# Patient Record
Sex: Female | Born: 1935 | Race: White | Hispanic: No | Smoking: Never smoker
Health system: Southern US, Community
[De-identification: ages and names within clinical notes are randomized; demographics above are authoritative.]

## PROBLEM LIST (undated history)

## (undated) DIAGNOSIS — I10 Essential (primary) hypertension: Secondary | ICD-10-CM

## (undated) DIAGNOSIS — J45909 Unspecified asthma, uncomplicated: Secondary | ICD-10-CM

## (undated) HISTORY — PX: HIP SURGERY: SHX245

---

## 1997-10-20 ENCOUNTER — Other Ambulatory Visit: Admission: RE | Admit: 1997-10-20 | Discharge: 1997-10-20 | Payer: Self-pay | Admitting: *Deleted

## 1999-11-13 ENCOUNTER — Other Ambulatory Visit: Admission: RE | Admit: 1999-11-13 | Discharge: 1999-11-13 | Payer: Self-pay | Admitting: *Deleted

## 2000-10-10 ENCOUNTER — Ambulatory Visit (HOSPITAL_COMMUNITY): Admission: RE | Admit: 2000-10-10 | Discharge: 2000-10-10 | Payer: Self-pay | Admitting: Orthopedic Surgery

## 2000-10-10 ENCOUNTER — Encounter: Payer: Self-pay | Admitting: Orthopedic Surgery

## 2000-12-29 ENCOUNTER — Other Ambulatory Visit: Admission: RE | Admit: 2000-12-29 | Discharge: 2000-12-29 | Payer: Self-pay | Admitting: *Deleted

## 2001-01-01 ENCOUNTER — Ambulatory Visit: Admission: RE | Admit: 2001-01-01 | Discharge: 2001-01-01 | Payer: Self-pay | Admitting: Pulmonary Disease

## 2001-01-05 ENCOUNTER — Encounter: Payer: Self-pay | Admitting: Orthopedic Surgery

## 2001-01-12 ENCOUNTER — Inpatient Hospital Stay (HOSPITAL_COMMUNITY): Admission: RE | Admit: 2001-01-12 | Discharge: 2001-01-15 | Payer: Self-pay | Admitting: Orthopedic Surgery

## 2001-01-12 ENCOUNTER — Encounter: Payer: Self-pay | Admitting: Orthopedic Surgery

## 2001-01-15 ENCOUNTER — Inpatient Hospital Stay (HOSPITAL_COMMUNITY)
Admission: RE | Admit: 2001-01-15 | Discharge: 2001-01-20 | Payer: Self-pay | Admitting: Physical Medicine & Rehabilitation

## 2002-02-27 ENCOUNTER — Encounter: Payer: Self-pay | Admitting: Emergency Medicine

## 2002-02-27 ENCOUNTER — Inpatient Hospital Stay (HOSPITAL_COMMUNITY): Admission: EM | Admit: 2002-02-27 | Discharge: 2002-02-28 | Payer: Self-pay | Admitting: Emergency Medicine

## 2002-04-06 ENCOUNTER — Other Ambulatory Visit: Admission: RE | Admit: 2002-04-06 | Discharge: 2002-04-06 | Payer: Self-pay | Admitting: *Deleted

## 2003-04-27 ENCOUNTER — Encounter: Payer: Self-pay | Admitting: Gastroenterology

## 2003-04-27 ENCOUNTER — Encounter: Admission: RE | Admit: 2003-04-27 | Discharge: 2003-04-27 | Payer: Self-pay | Admitting: Gastroenterology

## 2003-07-05 ENCOUNTER — Inpatient Hospital Stay (HOSPITAL_COMMUNITY): Admission: EM | Admit: 2003-07-05 | Discharge: 2003-07-06 | Payer: Self-pay | Admitting: Emergency Medicine

## 2005-01-10 ENCOUNTER — Observation Stay (HOSPITAL_COMMUNITY): Admission: EM | Admit: 2005-01-10 | Discharge: 2005-01-11 | Payer: Self-pay | Admitting: Emergency Medicine

## 2005-03-06 ENCOUNTER — Inpatient Hospital Stay (HOSPITAL_COMMUNITY): Admission: RE | Admit: 2005-03-06 | Discharge: 2005-03-11 | Payer: Self-pay | Admitting: Orthopedic Surgery

## 2005-03-11 ENCOUNTER — Ambulatory Visit: Payer: Self-pay | Admitting: Physical Medicine & Rehabilitation

## 2005-03-11 ENCOUNTER — Inpatient Hospital Stay (HOSPITAL_COMMUNITY)
Admission: RE | Admit: 2005-03-11 | Discharge: 2005-03-15 | Payer: Self-pay | Admitting: Physical Medicine & Rehabilitation

## 2005-06-21 ENCOUNTER — Ambulatory Visit: Payer: Self-pay | Admitting: Internal Medicine

## 2005-06-27 ENCOUNTER — Ambulatory Visit: Payer: Self-pay | Admitting: Pulmonary Disease

## 2005-08-15 ENCOUNTER — Ambulatory Visit: Payer: Self-pay | Admitting: Pulmonary Disease

## 2005-11-26 ENCOUNTER — Ambulatory Visit: Payer: Self-pay | Admitting: Pulmonary Disease

## 2006-11-07 IMAGING — CR DG HIP (WITH OR WITHOUT PELVIS) 2-3V*L*
3 series · 3 of 3 positions shown · non-contrast
Comparison: none

CLINICAL DATA: Recurrent hip dislocation.   Preoperative film. 
 LEFT HIP - 2 VIEW AND PELVIS - 1 VIEW:

[t pelvis a.p.]
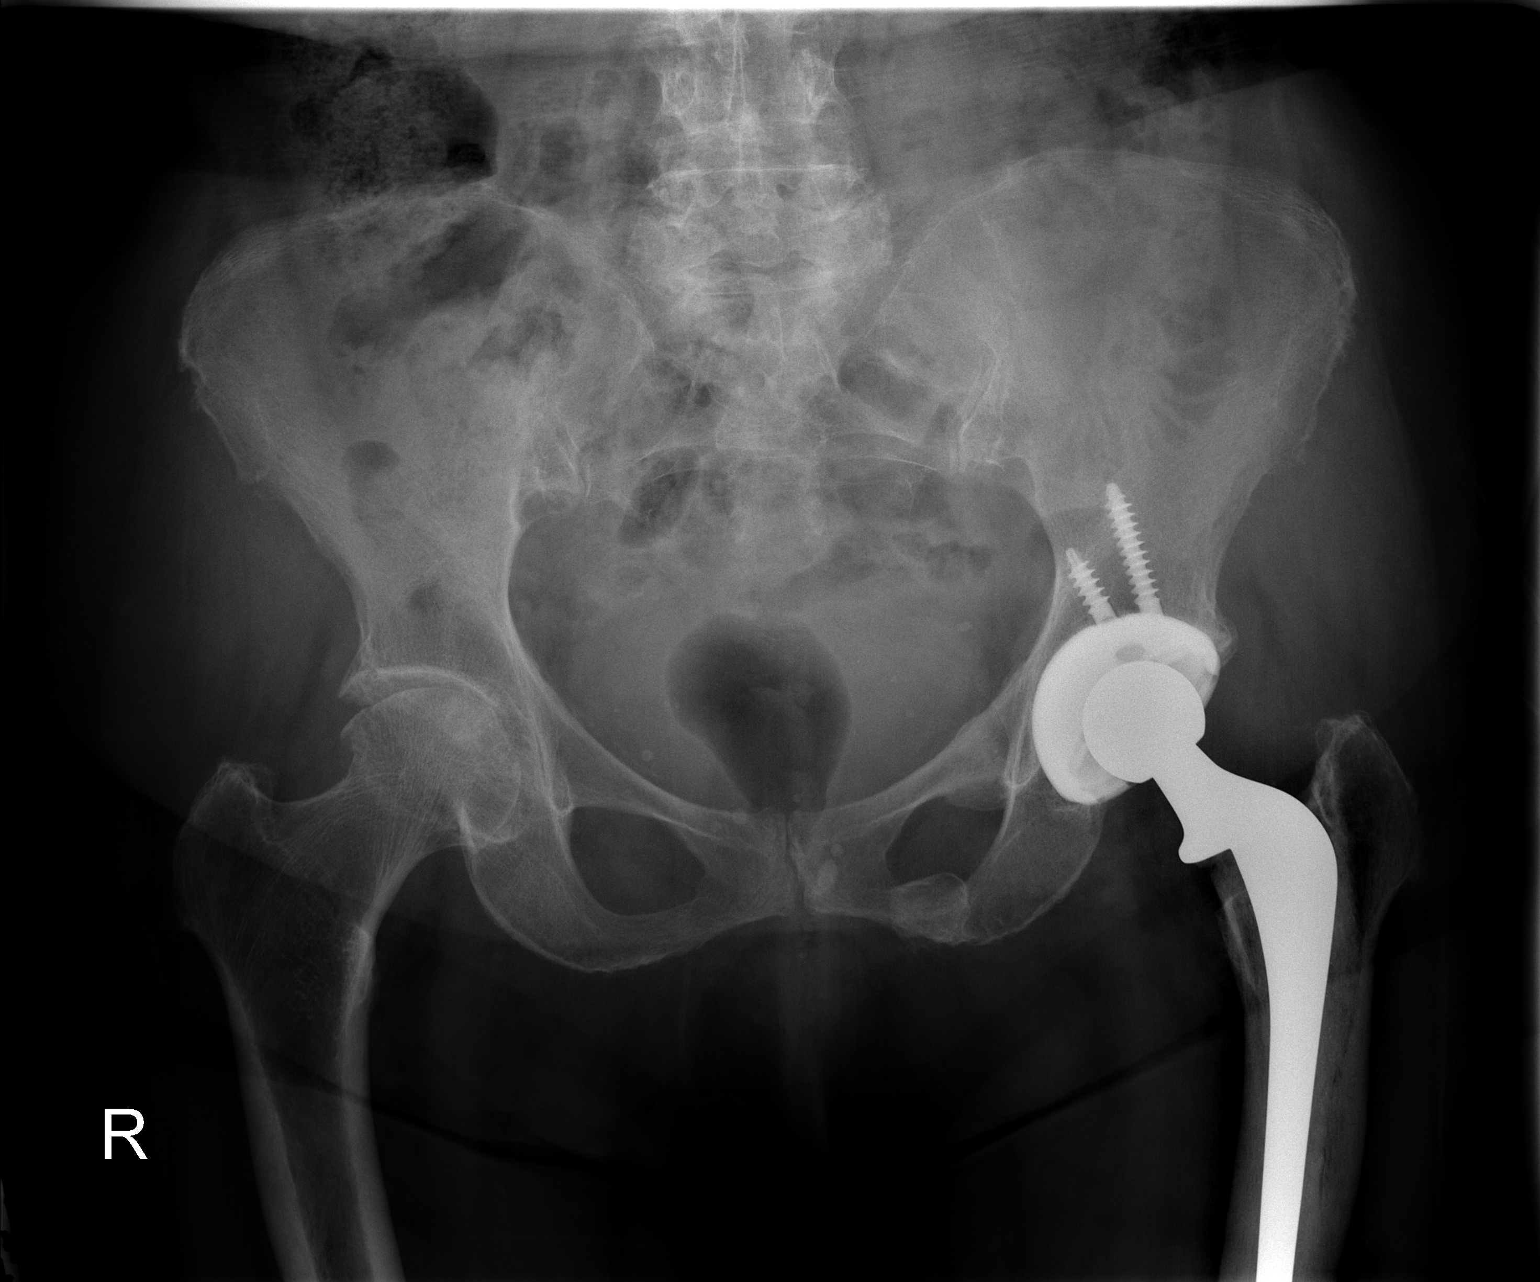

[t hip ap left]
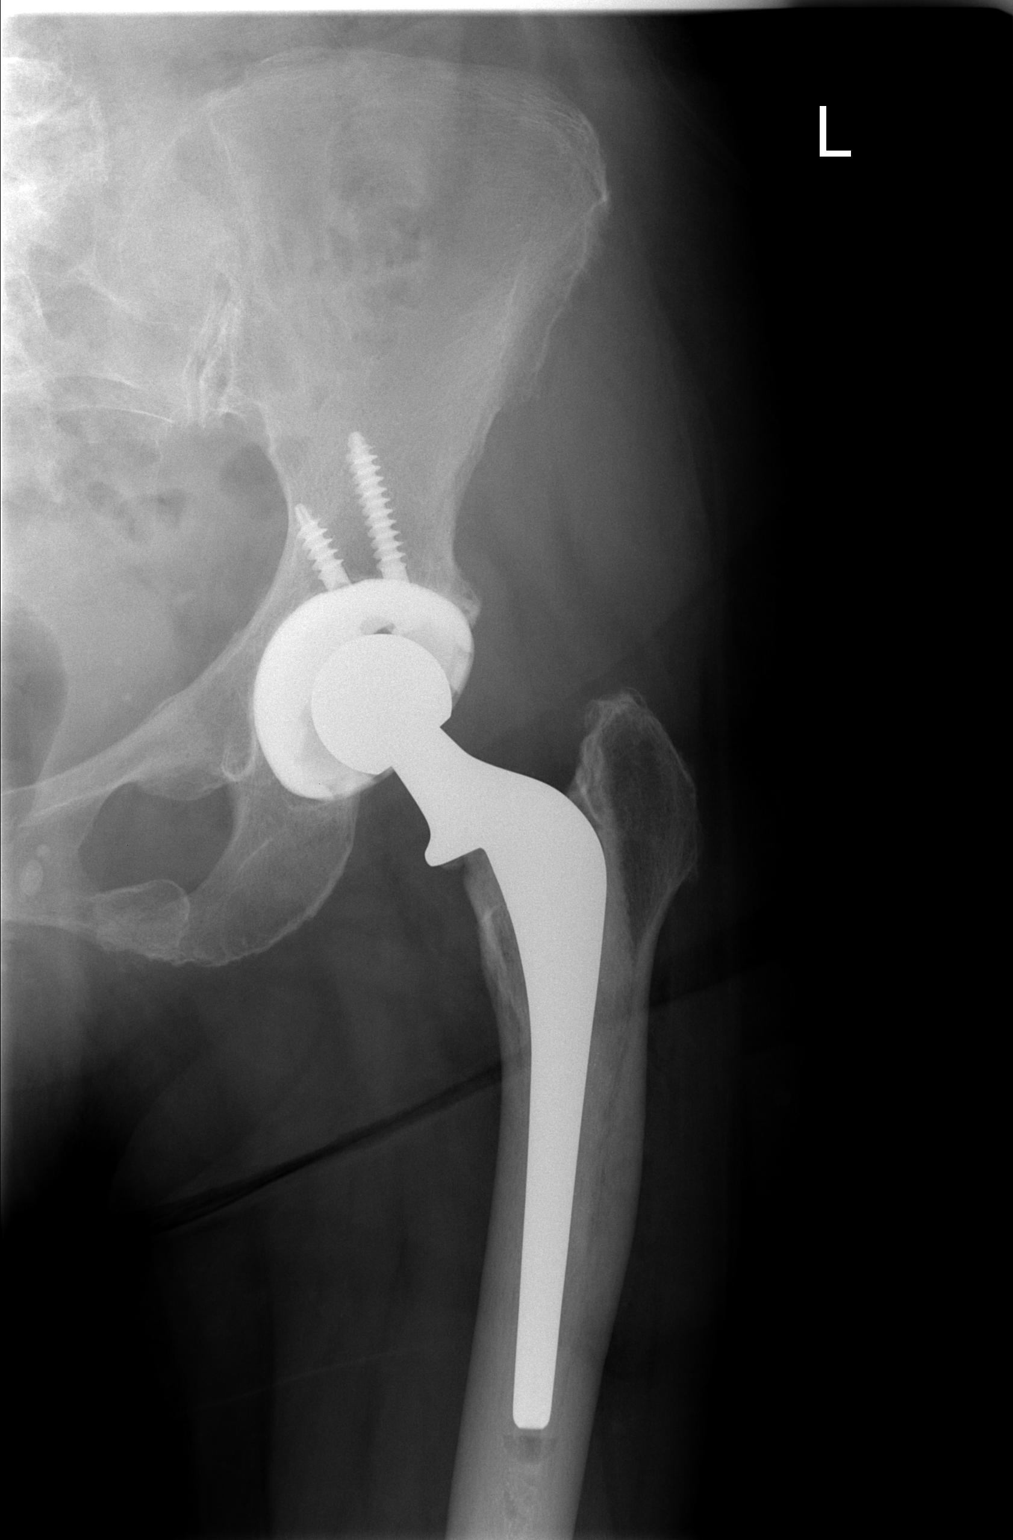

[t hip frog leg left]
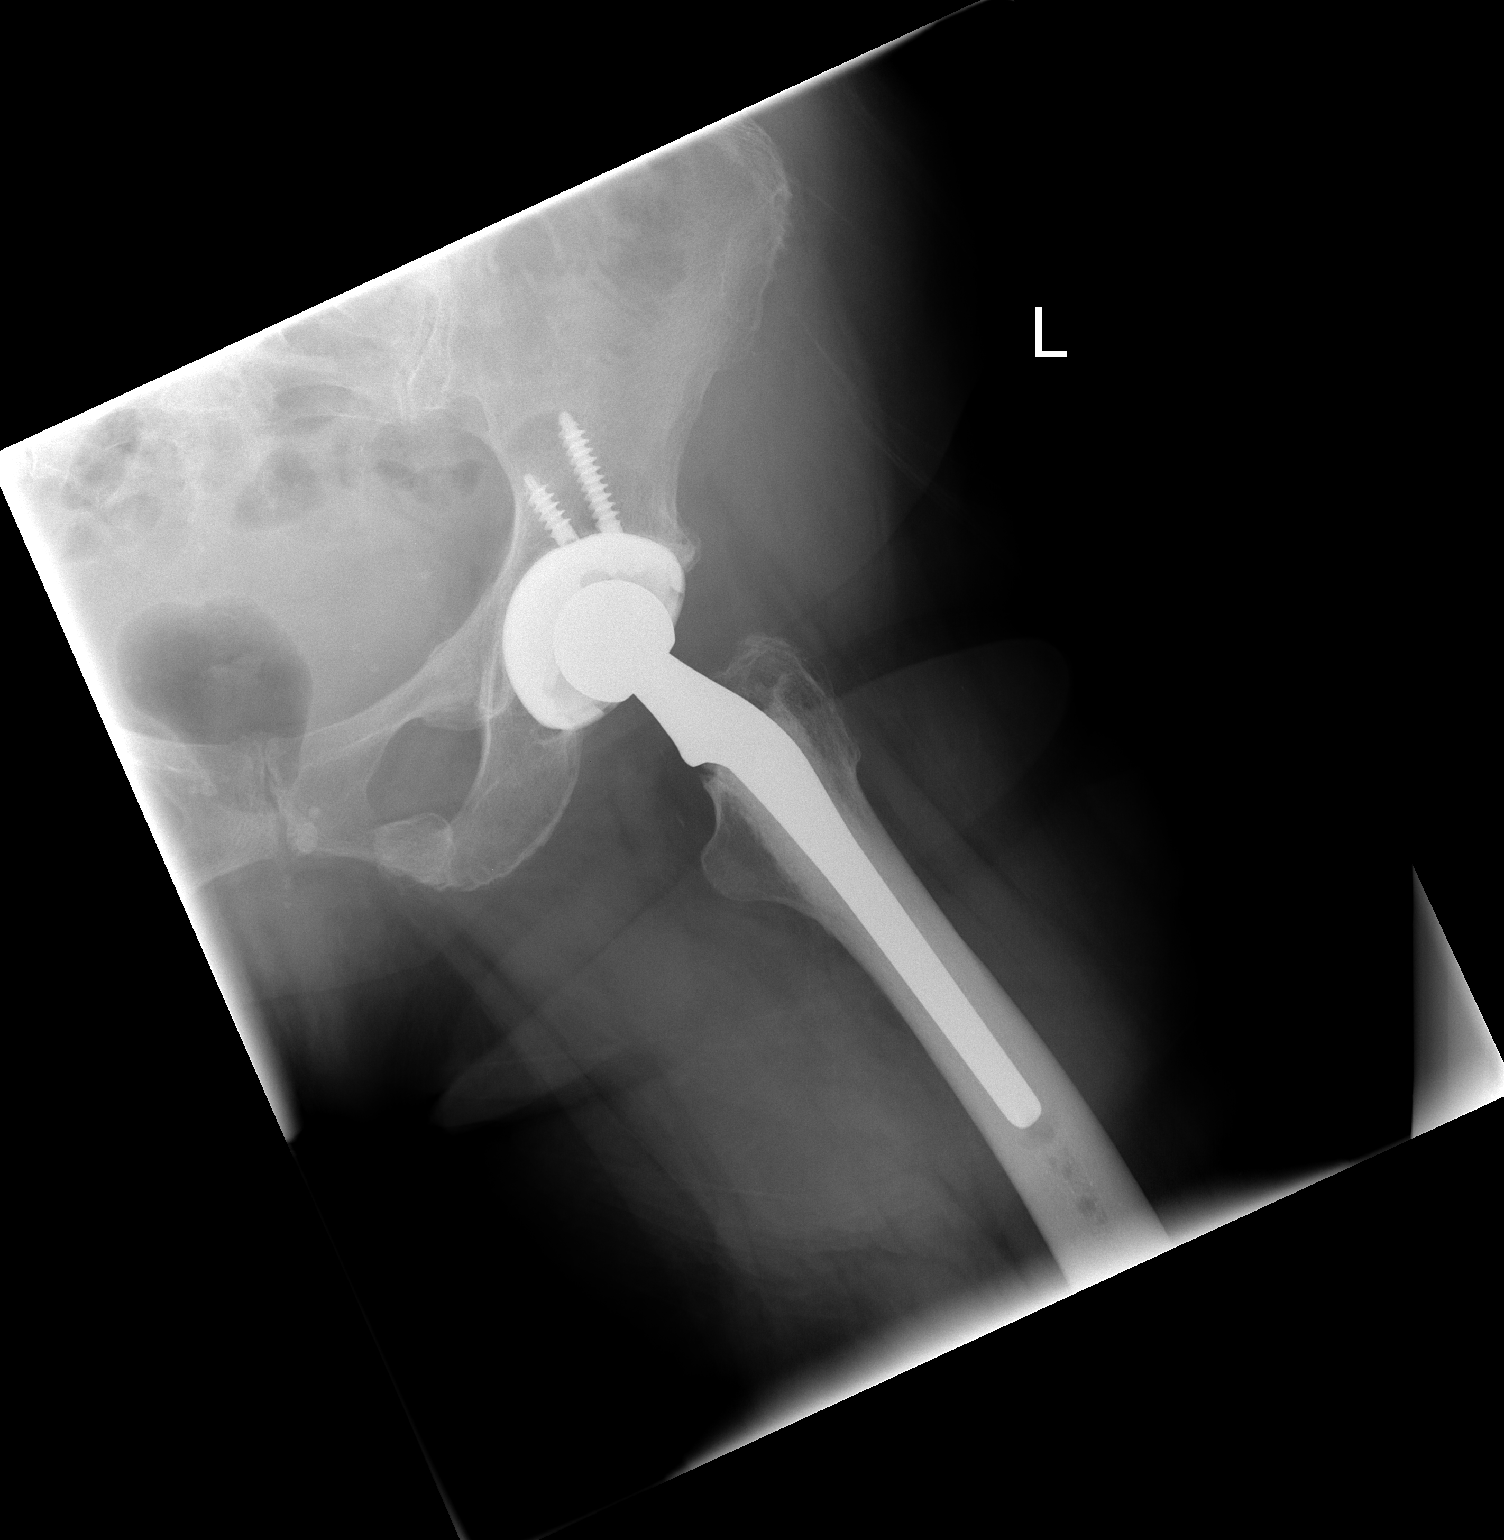

[3 of 3 positions shown; findings below may reference images not displayed]

FINDINGS: The patient has a total left hip arthroplasty.  Device is located.  No acute fracture.  Old left superior and inferior pubic rami fractures are noted.  Patient has marked right hip osteoarthritis.
IMPRESSION: 1. Left total hip arthroplasty without evidence of complication.  
 2. Old left superior and inferior pubic rami fractures. 
 3. Right hip osteoarthritis.

## 2010-11-23 NOTE — Discharge Summary (Signed)
[facility]  Patient:    [name], [name]                      MRN: [id number] Adm. Date:  [id number] Disc. Date: 01/15/01 Attending:  Loanne Drilling Dictator:   Irena Cords, P.A.-C CC:         Nadine Counts, M.D., [place], [place]   Discharge Summary  PRIMARY DIAGNOSES:  1. Osteoarthritis, left hip.  2. Hyponatremia -- improving.  3. Hypokalemia -- resolved.  4. Acute postop hemolytic anemia -- stabilizing.  SECONDARY DIAGNOSES:  1. Mild hypertension.  2. Gastroesophageal reflux disease.  3. Osteoporosis.  SURGICAL PROCEDURE:  Left total hip arthroplasty by Dr. Ollen Gross with the assistance of Ralene Bathe, P.A. on January 12, 2001; please see operative summary for further details.  CONSULTATIONS:  Dr. Rande Brunt. [facility] in Physical Medicine and Rehabilitation.  LABORATORY DATA:  A chest x-ray from July 07, 2000 showed no acute abnormalities, just chronic lung disease, chronic hiatal hernia suspected and then chronic compression fracture of T12.  Pulmonary function tests from January 01, 2001 showed minimal obstructive lung defect.  An adenosine nuclear medicine study from December 23, 2000 showed normal nuclear scan, normal gated images and normal ejection fraction.  Preop EKG from December 16, 2000 showed normal sinus rhythm and borderline left atrial abnormality and this is unconfirmed.  Preop hemoglobin was 11.7; this dropped down to 9.0 on July 11th.  Preop chemistry was all within normal limits with the exception of a low sodium of 130, potassium was 3.9, creatinine was 0.9; this dropped to a low on June 10th of sodium 126 and potassium of 3.4.  Patients PT and INR were 19.1 and 1.9, respectively, on July 11th.  On July 11th, the sodium had improved to 128 and potassium of 4.8.  CHIEF COMPLAINT:  Left hip pain.  BRIEF HISTORY OF PRESENT ILLNESS:  The patient is an 77 year old female, independent ambulator, who has had worsening left  hip and groin pain.  This has been progressively interfering with activities of daily living.  X-rays demonstrated bone-on-bone changes to that hip.  Patient did not seem to be improving with conservative measures and was interested in surgical intervention.  Dr. Lequita Halt discussed the risks, benefits and alternatives to surgical procedure with the patient.  She voiced understanding and wished to proceed.  Signed surgical consents and preoperative labs were obtained.  Also, the patient was sent for cardiac and pulmonary clearance as well as to be seen by her family physician.  All her cardiac nuclear stress tests were normal; she had stable lung function as well.  Dr. Nadine Counts also cleared the patient for the surgical procedure.  HOSPITAL COURSE:  Following the surgical procedure, patient was taken to the recovery room in stable condition and transferred to the orthopedic floor in good condition.  She did well during her hospital stay.  She was started on Coumadin for DVT prophylaxis.  Her INRs reached a level of 1.9 prior to discharge.  She was initially on PCA morphine for pain management; this was then switched over to Darvocet-N p.o., which she tolerated well and controlled her pain well.  Patient was noted to have some hyponatremia postop.  Her IV fluids were changed to normal saline initially.  She continued to drop to a low of 126 sodium.  She was then fluid restricted to 1200 cc and this improved her sodium to 128.  She had some postop hypokalemia as well  with a low of 3.4 on July 10th.  She was treated with K-Dur 40 mEq q.2h. x 3 doses.  The following day on July 11th, her potassium had improved to 4.8.  Also on July 11th, her hemoglobin was down to 9.0 but the patient was asymptomatic so no blood transfusion was performed.  Physical therapy and occupational therapy were started with the patient for total hip protocol.  She was touchdown weightbearing on that hip; total hip  precautions were followed as well.  A rehab consult was placed and Dr. Thomasena Edis evaluated the patient and felt she was an acceptable candidate for SACU placement.  By postop day 3, she was stable orthopedically and was ready for transfer to the [facility].  Patient also received four doses of Ancef 1 g IV postop while in the hospital.  DISCHARGE DISPOSITION:  Patient will be discharged to the [facility].  DIET:  Regular.  FOLLOWUP:  Follow up with Dr. Lequita Halt two weeks postop; she is to call [phone] for an appointment.  ACTIVITY:  Follow total hip dislocation precautions.  She is to remain touchdown weightbearing to 50% partial weightbearing with the assistance of physical therapy and occupational therapy.  SPECIAL DISCHARGE INSTRUCTIONS:  Once-daily dressing changes.  She may begin showering on postop day #5.  Pro times per protocol.  She should have an H&H repeated in the morning on July 12th to make sure her hemoglobin does not continue dropping.  DISCHARGE MEDICATIONS:  1. Fosamax 70 mg every Wednesday.  2. Prevacid 30 mg p.o. b.i.d.  3. Dyazide 37.5/25 mg p.o. q.a.m.  4. Calcium 250 mg p.o. q.i.d.  5. Multivitamin one p.o. q.d.  6. Trinsicon one p.o. t.i.d. p.c.  7. Robaxin 500 mg p.o. q.8h. p.r.n. spasm.  8. Darvocet-N 100 one to two p.o. q.4-6h. p.r.n. pain, maximum of 4 g of     acetaminophen in 24-hour period.  9. Coumadin per pharmacy dosing. 10. Rhinocort one spray q.d.  CONDITION ON DISCHARGE:  Good and improved. DD:  01/15/01 TD:  01/15/01 Job: [id number]

## 2010-11-23 NOTE — Op Note (Signed)
[name], [name]               ACCOUNT NO.:  [id number]   MEDICAL RECORD NO.:  [id number]          PATIENT TYPE:  INP   LOCATION:  0005                         FACILITY:  [facility]   PHYSICIAN:  Ollen Gross, M.D.    DATE OF BIRTH:  1915/05/22   DATE OF PROCEDURE:  03/06/2005  DATE OF DISCHARGE:                                 OPERATIVE REPORT   PREOPERATIVE DIAGNOSIS:  Recurrent instability, left hip.   POSTOPERATIVE DIAGNOSIS:  Recurrent instability, left hip.   PROCEDURE:  Left hip acetabular revision to constrain liner.   SURGEON:  Ollen Gross, M.D.   ANESTHESIA:  Alexzandrew L. Julien Girt, P.A.   ANESTHESIA:  General.   ESTIMATED BLOOD LOSS:  Less than 100.   DRAINS:  Hemovac x1.   COMPLICATIONS:  None.   CONDITION:  Stable to recovery.   CLINICAL ILLNESS AND PURPOSE:  An 77 year old female had a left total hip  done several years ago. She had had three dislocations approximately a year  apart each. She has not been fully compliant with her hip precautions which  has lead to dislocations. It is felt that she would benefit from conversion  to a more constrained system to prevent future dislocations in the future.  She presents now for revision to a constrained implant.   PROCEDURE IN DETAIL:  After successful administration of spinal anesthetic,  the patient is placed in a right lateral decubitus position with the left  side up and held with the hip positioner. Left lower extremity was isolated  from her perineum with plastic drapes and prepped and draped in the usual  sterile fashion. Previous posterolateral incisions were utilized. Skin cut  with a 10 blade, through subcutaneous tissue to the level of the fascia lata  which was incised in line with the skin incision. Sciatica nerve was  palpated and protected, and the pseudocapsule excised off the femur. The  joint was identified and the hip placed through a range of motion. She was  dislocating posteriorly at a  position of approximately 70 degrees flexion  and 40 degrees abduction and 40 degrees internal rotation. She otherwise was  stable.   I removed the femoral head with a bone tap and then retracted the femur  anteriorly to gain acetabular exposure. The polyethylene liner is removed  from the acetabular shell with the extraction component. The shell is in  excellent position as is the femoral component. It was a size 50-mm Pinnacle  acetabular shell. We put the 50-mm Pinnacle constrained liner into the  acetabular shell and impacted it so it was locked into position. A 28+ 1.5  head is placed onto the femoral stem, and the hip is reduced. The locking  ring is then passed over the acetabular polyethylene. The hip is placed  through a range of motion. She was stable to full extension, flexion, and  rotation, 70 degrees flexion, 40 degrees adduction, and about 70 degrees  internal rotation and 90 degrees flexion, about 70 degrees internal  rotation. The socket was not impinging upon at those limits of range of  motion. Wound is then  copiously irrigated with saline solution, and the  pseudocapsule reattached to the femur posteriorly for drill holes. Fascia  lata was closed over a Hemovac drain with #1 Vicryl and Ethibond. Subcu  closed with #1 Vicryl, then 2-0 Vicryl, and subcuticular running 4-0  Monocryl. Incision is clean and dry. Steri-Strips and a bulky sterile  dressing applied. Drain was hooked to suction, and she was awakened and  transported to recovery in stable condition.      Ollen Gross, M.D.  Electronically Signed     FA/MEDQ  D:  03/06/2005  T:  03/06/2005  Job:  [id number]

## 2010-11-23 NOTE — Op Note (Signed)
[facility]  Patient:    [name], [name]                      MRN: [id number] Proc. Date: 01/12/01 Adm. Date:  [id number] Attending:  Ollen Gross V                           Operative Report  PREOPERATIVE DIAGNOSIS:  Osteoarthritis left hip.  POSTOPERATIVE DIAGNOSIS:  Osteoarthritis left hip.  PROCEDURE:  Left total hip arthroplasty.  SURGEON:  Ollen Gross, M.D.  ASSISTANT:  French Ana Shuford, P.A.-C.  ANESTHESIA:  General.  ESTIMATED BLOOD LOSS:  400.  DRAINS:  Hemovac x 1.  COMPLICATIONS:  None.  CONDITION:  Stable to recovery.  BRIEF CLINICAL NOTE:  [name] is an 77 year old female with severe osteoarthritis of the left hip with pain refractory to nonoperative management.  She presents now for left total hip arthroplasty.  PROCEDURE IN DETAIL:  After the successful administration of general anesthetic, the patient was placed in the right lateral decubitus position with the left side up and held with the hip positioner.  The left lower extremity was isolated from the perineum with plastic drapes and prepped and draped in the usual sterile fashion.  Short posterolateral incision was then made, skin cut with 10 blade, subcutaneous tissue to the level of the fascia lata which was incised in line with the skin incision.  Short external rotators were isolated off the femur.  Sciatic nerve was palpated and then protected.  Capsulectomy was performed and the hip dislocated.  Center of the femoral head was marked, and a trial prosthesis was placed such that the center of the trial head corresponds with the center of her native femoral head.  The osteotomy was then made with an oscillating saw.  The femoral head was removed, the femur retracted anteriorly, and the intracapsule removed. Acetabular exposure was then obtained.  Acetabular reaming was then initiated, starting with a size 43, coursing increments of 2 to 49 and then a 50 mm  Pinnacle acetabular shell is impacted into the acetabulum, matching her native anteversion.  Had excellent fit and was then transfixed with two domed screws.  Trial 28 mm neutral liner was placed.  The femur was then prepared, first with the canal finder, and then the canal was irrigated.  A size 0 and then size 1 broach for the endurance stem is placed.  A 2 high offset neck is then placed onto the size 1 stem.  Reduction is performed with a 28 plus 1.5 head.  She had excellent stability with full extension, full external rotation, and 70 degrees flexion and 40 degrees adduction and 90 degrees internal rotation, then 90 degrees flexion and 90 degrees internal rotation.  The trials were then removed, and the permanent apex hole eliminator was placed into the acetabular shell.  A permanent 28 mm neutral Marathon liner was placed.  The canal restrictor was placed at the appropriate depth in the femoral canal, and then the canal was irrigated with pulsatile lavage.  Cement is mixed and once ready for implantation, is injected into the canal and pressurized.  A size 2 high offset Endurance Luster stem is placed, matching her native anteversion.  Extruded cement is removed.  Once the cement is fully hardened, the permanent 28 plus 1.5 head is placed.  The hip is then reduced with excellent stability throughout.  The wound is copiously irrigated with antibiotic  solution, and the short external rotators are reattached to the femur through drill holes.  The fascia lata and fascia were matched and then closed over a Hemovac drain with interrupted #1 Vicryl, subcutaneous closed with interrupted #1 and 2-0 Vicryl, and subcuticular closed with running 4-0 Monocryl.  Incision is clean and dry and Steri-Strips and a bulky sterile dressing applied.  The patient is then awakened and transported to recovery in stable condition. DD:  01/12/01 TD:  01/12/01 Job: [id number]

## 2010-11-23 NOTE — H&P (Signed)
[facility]  Patient:    [name], [name]                     MRN: [id number] Adm. Date:  01/12/01 Attending:  Ollen Gross, M.D. Dictator:   Druscilla Brownie. Shela Nevin, P.A. CC:         Marguerite Olea, M.D., [place], [place]  Ph# [phone]   History and Physical  DATE OF BIRTH:  [birth year]  CHIEF COMPLAINT:  "Problems with my left hip."  HISTORY OF PRESENT ILLNESS:  This is an 77 year old white female seen by Dr. Lequita Halt for continuing and progressive problems concerning her left hip. She is an active lady but has had progressive worsening pain with her left hip with groin pain as well.  She has been seen by [place] over the last several months with progressive problems.  She has developed a flexion contracture to the knee, to the hip.  She has limitation in range of motion at 5 degrees external rotation, 15-20 degrees abduction.  She has pain on any attempt at hip motion. X-rays taken and compared with a three-month interval of the hip shows further worsening of bone-on-bone changes.  After much discussion and the fact that this is an active lady, it was felt that she would benefit with surgical intervention and being admitted for total hip replacement arthroplasty to the left hip.  The patient has been seen by her family physician and had cardiac and pulmonary clearance.  The cardiology/nuclear medicine stress tests were normal.  Normal nuclear scan, normal gaited images, and normal ejection fraction.  Electrocardiogram showed normal sinus rhythm with a rate of 70 with borderline left atrial abnormality.  The patients granddaughter, who is a Designer, jewellery, accompanied her to her history and physical and has stated that Dr. Marguerite Olea has cleared her for this surgical procedure.  PAST SURGICAL HISTORY:  The patient has had bilateral lens extraction with implants, tonsillectomy as a child.  PAST MEDICAL HISTORY:  Hiatal hernia and mild  hypertension.  She also has reflux disease.  CURRENT MEDICATIONS: 1. Fosamax 70 mg 1 q.d. 2. Prevacid 30 mg 1 b.i.d. 3. Dyazide/HCTZ 37.5/25 mg 1 q.d. 4. Rhinocort nose spray each nostril once a day.  ALLERGIES: 1. CODEINE, which causes dizziness and headaches. 2. VERSED, which causes heart palpitations, weakness, and PVCs.  SOCIAL HISTORY:  Dr. Nadine Counts in [place], [place] is her medical physician.  His phone number is [phone].  The patient neither smokes nor drinks.  FAMILY HISTORY:  Positive for arthritis, PE, and heart failure in the mother and the father died with a "hemorrhage in the stomach."  REVIEW OF SYSTEMS:  CNS:  No seizures or paralysis, numbness, or double vision.  RESPIRATORY:  No productive cough, no hemoptysis, no shortness of breath.  CARDIOVASCULAR:  No chest pain, no angina, no orthopnea. GASTROINTESTINAL:  No nausea, vomiting, melena, or bloody stools. GENITOURINARY:  No discharge, dysuria, hematuria.  MUSCULOSKELETAL:  Primarily in present illness with her hip.  PHYSICAL EXAMINATION:  GENERAL:  Alert and cooperative, friendly, fully oriented 77 year old female who is accompanied by her granddaughter.  HEENT:  Normocephalic.  PERRLA.  EOM intact.  Oropharynx was clear.  CHEST:  Clear to auscultation.  No rhonchi, no rales.  HEART:  Regular rate and rhythm.  No murmurs are heard.  ABDOMEN:  Soft, nontender.  Liver and spleen not felt.  Bowel sounds present.  GENITALIA/RECTAL/PELVIC/BREAST:  Not done, not pertinent to present  illness.  EXTREMITIES:  Left hip as in present illness above.  ADMISSION DIAGNOSES: 1. Severe arthrosis of the left hip. 2. Hypertension. 3. Gastroesophageal reflux disease.  PLAN:  The patient will be admitted for left total hip replacement arthroplasty.  As her family physician is in [place], should we need a cardiologist or general internal medicine physician, then we will need to obtain consult from  the [place] area. DD:  01/06/01 TD:  01/06/01 Job: [id number]

## 2010-11-23 NOTE — Op Note (Signed)
[name], [name]                        ACCOUNT NO.:  [id number]   MEDICAL RECORD NO.:  [id number]                   PATIENT TYPE:  EMS   LOCATION:  ED                                   FACILITY:  [facility]   PHYSICIAN:  Vania Rea. Supple, M.D.               DATE OF BIRTH:  05-17-1915   DATE OF PROCEDURE:  02/27/2002  DATE OF DISCHARGE:                                 OPERATIVE REPORT   PREOPERATIVE DIAGNOSIS:  Dislocated left total hip arthroplasty.   POSTOPERATIVE DIAGNOSIS:  Dislocated left total hip arthroplasty.   PROCEDURE:  Closed reduction of dislocated left total hip arthroplasty.   SURGEON:  Vania Rea. Supple, M.D.   ANESTHESIA:  IV sedation.   HISTORY:  [name] is an 77 year old female who is approximately one year  out from a left total hip arthroplasty performed by Dr. Lequita Halt and had been  doing extremely well until earlier this morning when she was bending over to  pick up an object and she felt her left hip pop with immediate pain and  inability to bear weight. She initially presented to the emergency room down  in [place] where x-rays showed a left hip dislocation. She was transferred  to the [facility] Emergency Room where evaluation confirmed foreshortening  and internal rotation of the left hip with radiographs confirming a  posterior superior dislocation of the left hip. She was brought to the  operating room at this time for planned closed reduction.   Preoperatively I had counseled Ms. Leeth and her family members on  treatment options as well as the risks versus benefits thereof. The possible  complications of failure of closed reduction as well as recurrent  dislocation and neurovascular injury were reviewed. They understand and  accept and agreed with our planned procedure.   DESCRIPTION OF PROCEDURE:  After undergoing routine preoperative evaluation,  the patient was brought to the operating room and placed supine on the  operating table where  she was given IV sedation. After the appropriate  relaxation was achieved, the left hip was then gently reduced with a flexion  adduction and internal rotation to the hip followed by gentle longitudinal  traction. Leg lengths were restored equally and good mobility of the hip was  noted clinically. Fluoroscopic images were then obtained and the hip was  taken through a range of motion showing concentric reduction of the hip and  good stability. An abduction pillow was then placed between the legs. The  patient was then transferred to the recovery room in stable condition.                                               Vania Rea. Supple, M.D.    KMS/MEDQ  D:  02/27/2002  T:  03/02/2002  Job:  [id number]

## 2010-11-23 NOTE — Discharge Summary (Signed)
[place]. [facility]  Patient:    [name], [name]                      MRN: [id number] Adm. Date:  [id number] Disc. Date: [id number] Attending:  Herold Harms Dictator:   Junie Bame, P.A. CC:         Rande Brunt. Thomasena Edis, M.D.  Ollen Gross, M.D.  Dr. Nadine Counts   Discharge Summary  DISCHARGE DIAGNOSES: 1. Left total hip arthroplasty secondary to osteoarthritis. 2. Osteoporosis. 3. Hypertension. 4. Gastroesophageal reflux disease. 5. Hiatal hernia. 6. Anemia. 7. Status post hyponatremia.  HISTORY OF PRESENT ILLNESS:  The patient is an 77 year old, white female admitted at [facility] on January 12, 2001, for a left total hip arthroplasty performed by Dr. Ollen Gross due to end-stage OA.  Hospital course was significant for anemia secondary to blood loss.  The patient is on Coumadin for DVT prophylaxis.  Physical therapy report at that time indicated that the patient was ambulating with minimal assist of 40 feet with rolling walker and transfer sit to stand with minimal assist.  The patient is 50% posture weightbearing on the left.  PAST MEDICAL HISTORY: 1. Hiatal hernia. 2. Hypertension. 3. Gastroesophageal reflux disease.  PAST SURGICAL HISTORY: 1. Bilateral limb extractions with implants. 2. Tonsillectomy.  PRIMARY PHYSICIAN:  Dr. Nadine Counts.  MEDICATIONS: 1. Fosamax. 2. Prevacid. 3. Dyazide. 4. Rhinocort. 5. Calcium. 6. Aspirin.  ALLERGIES:  CODEINE and VERSED.  SOCIAL HISTORY:  The patient lives in a one-level home.  She is widow and has one child.  She has family to assist after discharge.  She denies any alcohol or tobacco use.  She has three steps to entry.  FAMILY HISTORY:  Noncontributory.  HOSPITAL COURSE:  Ms. Irby was admitted to rehabilitation on January 15, 2001, for comprehensive patient rehabilitation where she received more than three hours of PT and OT therapy daily.  Ms. Bier did fairly well during  her 75-day stay in rehabilitation.  Her hospital course is significant for anemia and mild hyponatremia.  Ms. Watanabe remained on Coumadin for DVT prophylaxis during her entire stay in rehabilitation.  She remained on Trinsicon p.o. b.i.d. for anemia.  Her blood pressure remained stable during her entire stay in rehabilitation.  Latest labs indicated that her sodium had improved at 134, potassium 4.8, BUN 17, creatinine 0.8 and glucose 96.  SGOT was 30 and SGPT was 49.  Hemoglobin was 10.4 and hematocrit was 30.4.  At the time of discharge, all vital signs were stable.  Latest PT report indicated that the patient was modified independent ambulating greater than 50 feet with standard walker.  Surgical incision demonstrated no signs of infection.  Steri-Strips were intact.  The patient was discharged home with the granddaughter.  DISCHARGE MEDICATIONS: 1. Prevacid 30 mg one tablet twice daily. 2. Dyazide 37.5 mg one tablet daily. 3. Coumadin 3 mg one tablet in the p.m. through February 12, 2001, then stop. 4. Calcium tablets 250 mg one tablet four times daily. 5. Fosamax 70 mg one tablet twice daily. 6. Trinsicon one tablet twice daily. 7. Darvocet-N 100 one to two tablets every four to six hours as needed for pain.  ACTIVITY:  The patient is to observe hip precautions.  She is still 50% partial weightbearing on the left and she is to use her walker.  SPECIAL INSTRUCTIONS:  She will have [facility] for PT/OT and to monitor Coumadin.  First draw  will be Thursday, January 22, 2001.  FOLLOWUP:  She is to follow up with Dr. Ollen Gross within two weeks. Follow up with her primary care Dustee Bottenfield, Dr. Polly Cobia, within four weeks. Follow up with Dr. Ellwood Dense as needed at [phone].  WOUND CARE:  It is okay to shower.  Steri-Strips will automatically fall off with time. DD:  01/20/01 TD:  01/20/01 Job: [id number]

## 2010-11-23 NOTE — Op Note (Signed)
NAMELAM, BJORKLUND NO.:  [id number]   MEDICAL RECORD NO.:  [id number]          PATIENT TYPE:  EMS   LOCATION:  ED                           FACILITY:  [facility]   PHYSICIAN:  Leonides Grills, M.D.     DATE OF BIRTH:  01/01/1915   DATE OF PROCEDURE:  01/10/2005  DATE OF DISCHARGE:                                 OPERATIVE REPORT   PREOPERATIVE DIAGNOSIS:  Dislocated left total hip arthroplasty.   POSTOPERATIVE DIAGNOSIS:  Dislocated left total hip arthroplasty.   OPERATION:  1.  Closed reduction, left dislocated total hip arthroplasty.  2.  Stress x-rays of the left hip.   ANESTHESIA:  General.   SURGEON:  Leonides Grills, M.D.   ASSISTANT:  Lianne Cure, PA-C.   Postoperative x-rays show a concentrically relocated left total hip  arthroplasty.   INDICATIONS:  An 77 year old female who sustained a third dislocation of her  left total hip arthroplasty.  She states that she was putting on her shoes  when she bent forward and the hip dislocated.  She is then taken to [facility] where x-rays were obtained.  I was consulted for further evaluation  and treatment.  She was consented for the above procedure.  All risks, which  could include a possibility of inability to reduce it, iatrogenic fracture,  and possible revision were all explained and questions were encouraged and  answered.   OPERATION:  Patient was brought to the operating room, placed in a supine  position after adequate general anesthesia was administered.  Under gentle  traction, 90-90 traction, there was a gentle reduction of the hip.  She  tolerated this very well and was stable to the recovery room with an  abduction splint applied.       PB/MEDQ  D:  01/10/2005  T:  01/10/2005  Job:  [id number]

## 2010-11-23 NOTE — Discharge Summary (Signed)
[name], [name]               ACCOUNT NO.:  [id number]   MEDICAL RECORD NO.:  [id number]          PATIENT TYPE:  INP   LOCATION:  1503                         FACILITY:  [facility]   PHYSICIAN:  Ollen Gross, M.D.    DATE OF BIRTH:  20-Jul-1914   DATE OF ADMISSION:  03/06/2005  DATE OF DISCHARGE:  03/11/2005                                 DISCHARGE SUMMARY   ADMISSION DIAGNOSES:  1.  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  2.  Hiatal hernia.  3.  Reflux disease.  4.  Hypertension.  5.  Asthma.  6.  Osteoporosis.   DISCHARGE DIAGNOSES:  1.  Recurrent instability, left hip, status post left hip acetabular      revision to conversion to a constrained liner.  2.  Postoperative hyponatremia.  3.  Hiatal hernia.  4.  Reflux disease.  5.  Hypertension.  6.  Asthma.  7.  Osteoporosis.   PROCEDURE:  March 06, 2005, left hip acetabular revision, conversion to a  constrained liner.  Surgeon Dr. Lequita Halt, assistant Avel Peace, PA-C.  Anesthesia general.  Hemovac drain x 1.   CONSULTS:  Rehab services.   BRIEF HISTORY:  The patient is an 77 year old female, who has undergone a  left total hip several years ago.  She has had 3 subsequently dislocations a  year apart, and she has not been fully compliant with her hip precautions  that has led to dislocations.  It was felt she would benefit from conversion  to a constrained system, now presents for surgery.   LABORATORY DATA:  Preop CBC:  Hemoglobin 12.6, hematocrit 38.1, white cell  count 5.9.  Differential:  Neutrophils 77, lymphs low at 9, monos elevated  at 13, eos 1, basos 1.  Postop hemoglobin 11.6. Last noted H&H 11.4 and  33.8.  PT/PTT preop:  13.4, and 35, respectively.  Serial pro times  followed.  Last noted PT/INR 22.5 and 2.0.  Chem panel on admission:  Preexisting preoperative level of a low sodium of 130, low chloride 94,  remaining chem panel within normal limits.  Serial BMETs were followed.  Sodium went up  to 131, down to 127, stabilized at 127.  Chloride dropped  from 94 to 91 back up to 92.  Urinalysis preop negative.  Blood group type O  positive.  Left hip films noted February 28, 2005, left hip arthroplasty  without evidence of complication.  Old left superior and inferior pubic  ramus fractures with right hip osteoarthritis.  Portable hip and pelvis on  March 06, 2005, left hip replacement components appear in satisfactory  position, alignment, radiopaque drain in place.   HOSPITAL COURSE:  Admitted to [facility], taken to the OR, and  underwent above procedure, tolerated it well, was started pain medications  postoperatively, underwent a rehab consult by rehab services and felt that  she may benefit from short stay in rehab.  Day 1, the patient started to get  up with physical therapy, had a constrained liner conversion.  Therefore,  she was allowed to be weightbearing as tolerated with therapy.  She did have  a little bit of hyponatremia, but this was noted to be preop.  This was felt  to be due to her medications.  By day 2, the patient had a little bit of  discomfort in and around the incision and some groin pain.  Her heart  medications were restarted, switched over to p.o. medications.  Dressing was  changed.  Incision looked good.  She did have a little bit of blistering  proximal to the incision which was covered with Tegaderm.  She started  getting up with physical therapy and ambulating approximately 40 feet and 60  feet by day 2 and then up to 120 feet by day 3.  She was followed through  the weekend waiting for a rehab bed.  She was on Coumadin per pharmacy  protocol.  She did well through the weekend.  It was noted on Monday,  September 4, that a bed did become available on rehab unit.  The patient was  slowly progressing with physical therapy, doing well, and was transferred  over at that time.   DISCHARGE PLAN:  The patient discharged to [facility].    DISCHARGE DIAGNOSES:  Please see above.   DISCHARGE MEDICATIONS:  Continue current medications as per the [facility] and will  be sent up with the patient.   DIET:  Continue current diet.   ACTIVITY:  She is weightbearing as tolerated left lower extremity.  Gait  training, ambulation, ADLs as per PT and OT.  Hip precautions.  Daily  dressing changes.  May start showering.  Follow up with Dr. Lequita Halt in the  office 2 weeks from surgery or upon discharge from the rehab unit.   DISPOSITION:  [facility].   CONDITION ON DISCHARGE:  Improved.      Alexzandrew L. Julien Girt, P.A.      Ollen Gross, M.D.  Electronically Signed    ALP/MEDQ  D:  05/02/2005  T:  05/02/2005  Job:  [id number]   cc:   Rehab Services

## 2010-11-23 NOTE — Discharge Summary (Signed)
NAMEJACIA, [name] NO.:  [id number]   MEDICAL RECORD NO.:  [id number]          PATIENT TYPE:  IPS   LOCATION:  4009                         FACILITY:  [facility]   PHYSICIAN:  Ellwood Dense, M.D.   DATE OF BIRTH:  08/30/1914   DATE OF ADMISSION:  03/11/2005  DATE OF DISCHARGE:  03/15/2005                                 DISCHARGE SUMMARY   DISCHARGE DIAGNOSIS:  1.  Left hip instability with chronic dislocations requiring left total hip      replacement revision.  2.  Hypertension, stable off Maxzide.  3.  Hyponatremia, much improved.   HISTORY OF PRESENT ILLNESS:  [name] is an 77 year old female with a  history of hypertension, left total hip replacement, with multiple  dislocations.  She elected to undergo left hip acetabular revision on August  30 by Dr. Lequita Halt.  Postop is weight-bearing as tolerated and on Coumadin  for DVT prophylaxis.  INR at 2 on admission.  The patient was noted to have  hyponatremia with sodium dropping down to 127.  She has refused Maxzide for  the past couple of days.  Therapy is initiated and the patient is at min  assist for transfers, supervision for ambulating 120 feet with rolling  walker, CIR was consulted for progressive therapies.   PAST MEDICAL HISTORY:  Significant for hyponatremia, sodium usually at 131  baseline, hypertension, constipation, excision of cataracts, occasional  bronchitis, allergies, frozen right shoulder, and peripheral edema.   ALLERGIES:  Codeine and Versed.   SOCIAL HISTORY:  The patient lives alone in [place], was independent and  driving prior to admission.  She does not use any tobacco or alcohol.  She  lives in a two level house with ramp at entry.   HOSPITAL COURSE:  [name] was admitted to rehab on March 11, 2005, for inpatient therapies to consist of PT and OT daily.  Past  admission, she was maintained on Coumadin for DVT prophylaxis.  Her left  total hip incision was noted  to be healing well at the time of admission and  has done well throughout her stay.  No signs or symptoms of infection noted.  Labs done past admission showed mild postop anemia with hemoglobin 11,  hematocrit 32, white count 5.5, platelets 283.  The patient's Maxzide was  held initially and recheck labs on September 5 revealed sodium at 133.  Blood pressures have been stable.  We attempted to resume Maxzide on  September 5, however, the patient has refused this, patient and family have  refused this secondary to patient's history of hyponatremia.  Last check of  labs of September 8 revealed sodium 134, potassium 4, chloride 99, CO2 30,  BUN 15, creatinine 0.8, glucose 94.  The patient's blood pressures at the  time of discharge are running from 115 to 140 systolic, 60s to 60A  diastolic.  The patient's p.o. intake has been good.  She has been continent  of bowel and bladder.  During her stay in rehab, [name] has made great  progress.  She is currently at modified  independent level for transfers,  modified independent for ambulating 200 feet with a rolling walker.  She is  at modified independent level for self-care needs including toileting.  She  requires some set up for advanced ADLs.  She will continue to receive  further follow up PT and OT by Home Health services with Home Health RN to  draw protime and pharmacy to follow Coumadin.  On September, 8, 2006, the  patient is discharged to home.   DISCHARGE MEDICATIONS:  Coumadin 2 mg 1 p.o. q.p.m., Senokot S 2 p.o.  q.h.s., Maxzide 37.5/25 1/2 p.o. daily, Protonix 40 mg b.i.d., Metoprolol  Elixir 6.25 mg b.i.d.   DISCHARGE INSTRUCTIONS:  Activities:  To follow left total hip precautions  and use a walker.  Diet is regular.  Wound care:  Wash area with soap and  water, keep clean and dry.  No alcohol, no smoking, no driving.  Home Health  to provide PT, OT, and RN.  Protime to be drawn on September 11, Coumadin to  continue through  September 30.  The patient is to follow up with Dr. Harrison Mons,  cardiologist, regarding hyponatremia and change regarding Maxzide to an  alternative medication.      Greg Cutter, P.A.    ______________________________  Ellwood Dense, M.D.    PP/MEDQ  D:  03/15/2005  T:  03/15/2005  Job:  [id number]   cc:   Ollen Gross, M.D.  [facility]  [address]  [address]  [place]  [place] [address]  Fax: [phone]

## 2010-11-23 NOTE — Op Note (Signed)
NAME:  [name], [name]                         ACCOUNT NO.:  [id number]   MEDICAL RECORD NO.:  [id number]                   PATIENT TYPE:  INP   LOCATION:  0467                                 FACILITY:  [facility]   PHYSICIAN:  Kerrin Champagne, M.D.                DATE OF BIRTH:  [birth year]   DATE OF PROCEDURE:  07/05/2003  DATE OF DISCHARGE:                                 OPERATIVE REPORT   PREOPERATIVE DIAGNOSIS:  Left total hip arthroplasty, posterior superior  dislocation.   POSTOPERATIVE DIAGNOSIS:  Left total hip arthroplasty, posterior superior  dislocation.   OPERATION/PROCEDURE:  Closed reduction, left total hip arthroplasty under  general anesthesia.   SURGEON:  Kerrin Champagne, M.D.   ANESTHESIA:  General orotracheal by Dr. Shireen Quan.   ESTIMATED BLOOD LOSS:  0 mL.   DRAINS:  None.   BRIEF CLINICAL HISTORY:  The patient is an 77 year old female, status post  left total hip arthroplasty by Dr. Ollen Gross two years ago.  She has had  a history of previous left hip dislocation almost a year ago treated with  bracing.  She reports sitting on a chair and putting on her pants and felt a  pop and immediate left hip pain with difficulty standing and bearing weight  on the left leg.  She was seen at [facility].  X-rays  demonstrated a dislocated hip.  She was transferred to [facility]  and admitted today to undergo a closed reduction of left total hip  arthroplasty.   INTRAOPERATIVE FINDINGS:  Left posterior superior total hip arthroplasty  dislocation.  Radiographs showed no evidence of fracture.  Intraoperative  findings suggest inability to flex the hip greater than 90 degrees up to  about 100 degrees without subluxation.  Internal rotation to some 50  degrees, external rotation up to 45-50 degrees.  Following reduction plain  radiographs demonstrated concentric reduction of the left total hip  arthroplasty in good position alignment.  No fracture  noted.   DESCRIPTION OF PROCEDURE:  After adequate general anesthesia, the patient in  her hospital bed, pressure applied against the anterior aspect of the  anterior superior iliac spine laterally and the left hip flexed to 90  degrees.  Internal rotation, adduction with longitudinal traction, multiple  palpable pop felt in the left hip as it reduced and the leg then brought  down into extension and external rotation.  Leg lengths appeared restored  but alignment into external rotation of about 30 degrees.  Radiograph  obtained with the patient in her bed and demonstrated a concentric reduction  of the left total hip arthroplasty.  The patient noted to have a small skin  slip over the left anterior superior iliac spine, probably secondary to  counter traction.  This is superficial and just at the level of the  dermis.  Painted with Betadine, Tegaderm applied.  The patient then placed  into a left knee immobilizer following attempts at determining range of  motion.  She was then reactivated, extubated and returned to the recovery  room in satisfactory condition.  All instrument and sponge counts were  correct.                                               Kerrin Champagne, M.D.    Myra Rude  D:  07/05/2003  T:  07/05/2003  Job:  [id number]

## 2010-11-23 NOTE — H&P (Signed)
[name], [name]               ACCOUNT NO.:  [id number]   MEDICAL RECORD NO.:  [id number]          PATIENT TYPE:  INP   LOCATION:  1503                         [facility]ACILITY:  [facility]   PHYSICIAN:  Ollen Gross, M.D.    DATE OF BIRTH:  28-Nov-1914   DATE OF ADMISSION:  03/06/2005  DATE OF DISCHARGE:                                HISTORY & PHYSICAL   DATE OF OFFICE VISIT/HISTORY AND PHYSICAL:  February 28, 2005.   DATE OF ADMISSION:  March 06, 2005.   CHIEF COMPLAINT:  Recurrent dislocation, left hip.   HISTORY OF PRESENT ILLNESS:  The patient is an 77 year old female well known  to Dr. Ollen Gross, having previously undergone hip replacement. She,  unfortunately, has had a chronic dislocating left hip and has had to be  reduced several times. Due to the increase in instability and dislocation,  it is felt that she would benefit from undergoing conversion of the left  total hip to a constrained liner. Risks and benefits of this procedure have  been discussed with the patient and she elects to proceed with surgery.   ALLERGIES:  No known drug allergies. Intolerance to CODEINE (causes  sickness).   CURRENT MEDICATIONS:  Prevacid 30 mg b.i.d., triamterene/hydrochlorothiazide  1/2 tablet daily, metoprolol 25 mg 1/4 tablets twice a day, Senokot twice a  day, Fosamax every week. She also is to take calcium and a multivitamin.   PAST MEDICAL HISTORY:  Hiatal hernia with reflux disease, hypertension,  asthma, osteoporosis.   PAST SURGICAL HISTORY:  Bilateral lens extraction, tonsillectomy, left total  hip replacement July 2002 with subsequent dislocations and closed  reductions.   SOCIAL HISTORY:  A widow, retired. Non-smoker. No alcohol. Her daughter will  be assisting with care after surgery.   FAMILY HISTORY:  Arthritis. History of pulmonary embolism. History of heart  failure in mother and father died with a hemorrhage in the stomach.   REVIEW OF SYSTEMS:  GENERAL:  No fever,  chills, or night sweats. NEUROLOGIC:  No seizure, syncope, or paralysis. RESPIRATORY:  No shortness of breath,  productive cough, or hemoptysis. CARDIOVASCULAR:  No chest pain, angina, or  orthopnea. GASTROINTESTINAL:  No nausea, vomiting, diarrhea, or  constipation. GENITOURINARY:  No dysuria or hematuria or discharge.  MUSCULOSKELETAL:  Left hip, history of present illness.   PHYSICAL EXAMINATION:  VITAL SIGNS:  Pulse 64, respiratory rate 14, blood  pressure 120/62.  GENERAL:  An 78 year old white female. Well developed, well nourished. Short  stature. No acute distress. She is alert, oriented, and cooperative. She is  accompanied by her daughter and granddaughter.  HEENT:  Normocephalic and atraumatic. Pupils are equal, round, and reactive.  Oropharynx clear. Extraocular muscles intact. She does have full upper and  lower dentures.  NECK:  Supple.  CHEST:  Clear.  HEART:  Regular rate and rhythm. No murmurs.  ABDOMEN:  Soft, nontender. Bowel sounds present.  RECTAL/BREAST/GENITALIA:  Not done. Not pertinent to present illness.  EXTREMITIES:  Left hip, she does have pain free passive range of motion of  the left hip. Motor function is intact. Sensation is  intact.   IMPRESSION:  Recurrent dislocations left total hip.   PLAN:  The patient admitted to [facility] to undergo  revision of the left total hip with conversion over to a constrained liner.      Alexzandrew L. Julien Girt, P.A.      Ollen Gross, M.D.  Electronically Signed    ALP/MEDQ  D:  03/10/2005  T:  03/10/2005  Job:  [id number]   cc:   Ollen Gross, M.D.  [facility]  [address]  [address]  [place]  [place] [address]  Fax: [phone]

## 2015-09-24 ENCOUNTER — Encounter (HOSPITAL_COMMUNITY): Payer: Self-pay | Admitting: Emergency Medicine

## 2015-09-24 ENCOUNTER — Inpatient Hospital Stay (HOSPITAL_COMMUNITY): Payer: Medicare Other

## 2015-09-24 ENCOUNTER — Inpatient Hospital Stay (HOSPITAL_COMMUNITY)
Admission: EM | Admit: 2015-09-24 | Discharge: 2015-09-29 | DRG: 481 | Disposition: A | Discharge status: Skilled Nursing Facility | Payer: Medicare Other | Attending: Family Medicine | Admitting: Family Medicine

## 2015-09-24 ENCOUNTER — Emergency Department (HOSPITAL_COMMUNITY): Payer: Medicare Other

## 2015-09-24 DIAGNOSIS — W19XXXA Unspecified fall, initial encounter: Secondary | ICD-10-CM | POA: Diagnosis present

## 2015-09-24 DIAGNOSIS — S7221XA Displaced subtrochanteric fracture of right femur, initial encounter for closed fracture: Secondary | ICD-10-CM

## 2015-09-24 DIAGNOSIS — D62 Acute posthemorrhagic anemia: Secondary | ICD-10-CM | POA: Diagnosis not present

## 2015-09-24 DIAGNOSIS — I952 Hypotension due to drugs: Secondary | ICD-10-CM | POA: Diagnosis present

## 2015-09-24 DIAGNOSIS — Z09 Encounter for follow-up examination after completed treatment for conditions other than malignant neoplasm: Secondary | ICD-10-CM

## 2015-09-24 DIAGNOSIS — T447X5A Adverse effect of beta-adrenoreceptor antagonists, initial encounter: Secondary | ICD-10-CM | POA: Diagnosis present

## 2015-09-24 DIAGNOSIS — S81812A Laceration without foreign body, left lower leg, initial encounter: Secondary | ICD-10-CM | POA: Diagnosis not present

## 2015-09-24 DIAGNOSIS — I10 Essential (primary) hypertension: Secondary | ICD-10-CM | POA: Diagnosis present

## 2015-09-24 DIAGNOSIS — Z419 Encounter for procedure for purposes other than remedying health state, unspecified: Secondary | ICD-10-CM

## 2015-09-24 DIAGNOSIS — I110 Hypertensive heart disease with heart failure: Secondary | ICD-10-CM | POA: Diagnosis present

## 2015-09-24 DIAGNOSIS — S72141A Displaced intertrochanteric fracture of right femur, initial encounter for closed fracture: Secondary | ICD-10-CM | POA: Diagnosis present

## 2015-09-24 DIAGNOSIS — R000 Tachycardia, unspecified: Secondary | ICD-10-CM | POA: Diagnosis present

## 2015-09-24 DIAGNOSIS — Z79899 Other long term (current) drug therapy: Secondary | ICD-10-CM | POA: Diagnosis not present

## 2015-09-24 DIAGNOSIS — I959 Hypotension, unspecified: Secondary | ICD-10-CM | POA: Diagnosis not present

## 2015-09-24 DIAGNOSIS — I5030 Unspecified diastolic (congestive) heart failure: Secondary | ICD-10-CM | POA: Diagnosis present

## 2015-09-24 DIAGNOSIS — S72001A Fracture of unspecified part of neck of right femur, initial encounter for closed fracture: Secondary | ICD-10-CM | POA: Diagnosis present

## 2015-09-24 DIAGNOSIS — J45909 Unspecified asthma, uncomplicated: Secondary | ICD-10-CM | POA: Diagnosis present

## 2015-09-24 DIAGNOSIS — W050XXA Fall from non-moving wheelchair, initial encounter: Secondary | ICD-10-CM | POA: Diagnosis present

## 2015-09-24 DIAGNOSIS — R011 Cardiac murmur, unspecified: Secondary | ICD-10-CM | POA: Diagnosis not present

## 2015-09-24 DIAGNOSIS — F0390 Unspecified dementia without behavioral disturbance: Secondary | ICD-10-CM | POA: Diagnosis present

## 2015-09-24 DIAGNOSIS — T464X5A Adverse effect of angiotensin-converting-enzyme inhibitors, initial encounter: Secondary | ICD-10-CM | POA: Diagnosis present

## 2015-09-24 DIAGNOSIS — R410 Disorientation, unspecified: Secondary | ICD-10-CM | POA: Diagnosis not present

## 2015-09-24 DIAGNOSIS — M25551 Pain in right hip: Secondary | ICD-10-CM | POA: Diagnosis present

## 2015-09-24 DIAGNOSIS — Z66 Do not resuscitate: Secondary | ICD-10-CM | POA: Diagnosis present

## 2015-09-24 DIAGNOSIS — I5032 Chronic diastolic (congestive) heart failure: Secondary | ICD-10-CM | POA: Diagnosis present

## 2015-09-24 DIAGNOSIS — Z8249 Family history of ischemic heart disease and other diseases of the circulatory system: Secondary | ICD-10-CM

## 2015-09-24 DIAGNOSIS — Y92009 Unspecified place in unspecified non-institutional (private) residence as the place of occurrence of the external cause: Secondary | ICD-10-CM | POA: Diagnosis not present

## 2015-09-24 DIAGNOSIS — I9581 Postprocedural hypotension: Secondary | ICD-10-CM | POA: Diagnosis not present

## 2015-09-24 HISTORY — DX: Essential (primary) hypertension: I10

## 2015-09-24 HISTORY — DX: Unspecified asthma, uncomplicated: J45.909

## 2015-09-24 LAB — GLUCOSE, CAPILLARY
GLUCOSE-CAPILLARY: 117 mg/dL — AB (ref 65–99)
Glucose-Capillary: 109 mg/dL — ABNORMAL HIGH (ref 65–99)

## 2015-09-24 LAB — BASIC METABOLIC PANEL
Anion gap: 11 (ref 5–15)
BUN: 29 mg/dL — AB (ref 6–20)
CHLORIDE: 102 mmol/L (ref 101–111)
CO2: 31 mmol/L (ref 22–32)
Calcium: 9.7 mg/dL (ref 8.9–10.3)
Creatinine, Ser: 0.99 mg/dL (ref 0.44–1.00)
GFR calc Af Amer: 53 mL/min — ABNORMAL LOW (ref >60)
GFR, EST NON AFRICAN AMERICAN: 46 mL/min — AB (ref >60)
Glucose, Bld: 137 mg/dL — ABNORMAL HIGH (ref 65–99)
POTASSIUM: 4.2 mmol/L (ref 3.5–5.1)
Sodium: 144 mmol/L (ref 135–145)

## 2015-09-24 LAB — CBC WITH DIFFERENTIAL/PLATELET
BASOS ABS: 0 10*3/uL (ref 0.0–0.1)
Basophils Relative: 0 %
EOS PCT: 0 %
Eosinophils Absolute: 0 10*3/uL (ref 0.0–0.7)
HCT: 39.4 % (ref 36.0–46.0)
HEMOGLOBIN: 12.5 g/dL (ref 12.0–15.0)
LYMPHS PCT: 5 %
Lymphs Abs: 0.4 10*3/uL — ABNORMAL LOW (ref 0.7–4.0)
MCH: 32.1 pg (ref 26.0–34.0)
MCHC: 31.7 g/dL (ref 30.0–36.0)
MCV: 101 fL — AB (ref 78.0–100.0)
Monocytes Absolute: 0.4 10*3/uL (ref 0.1–1.0)
Monocytes Relative: 5 %
NEUTROS PCT: 90 %
Neutro Abs: 7.2 10*3/uL (ref 1.7–7.7)
PLATELETS: 271 10*3/uL (ref 150–400)
RBC: 3.9 MIL/uL (ref 3.87–5.11)
RDW: 13.8 % (ref 11.5–15.5)
WBC: 8.1 10*3/uL (ref 4.0–10.5)

## 2015-09-24 MED ORDER — METOPROLOL TARTRATE 12.5 MG HALF TABLET
12.5000 mg | ORAL_TABLET | Freq: Two times a day (BID) | ORAL | Status: DC
  Administered 2015-09-24 – 2015-09-25 (×4): 12.5 mg via ORAL
  Filled 2015-09-24 (×4): qty 1

## 2015-09-24 MED ORDER — ALBUTEROL SULFATE (2.5 MG/3ML) 0.083% IN NEBU: 2.5000 mg | INHALATION_SOLUTION | Freq: Four times a day (QID) | RESPIRATORY_TRACT | Status: DC | PRN

## 2015-09-24 MED ORDER — SACCHAROMYCES BOULARDII 250 MG PO CAPS
250.0000 mg | ORAL_CAPSULE | Freq: Every day | ORAL | Status: DC
  Administered 2015-09-25 – 2015-09-29 (×5): 250 mg via ORAL
  Filled 2015-09-24 (×6): qty 1

## 2015-09-24 MED ORDER — MORPHINE SULFATE (PF) 2 MG/ML IV SOLN
0.5000 mg | INTRAVENOUS | Status: DC | PRN
  Filled 2015-09-24 (×2): qty 1

## 2015-09-24 MED ORDER — SODIUM CHLORIDE 0.9 % IV SOLN
INTRAVENOUS | Status: DC
  Administered 2015-09-24: 16:00:00 via INTRAVENOUS

## 2015-09-24 MED ORDER — FUROSEMIDE 20 MG PO TABS
20.0000 mg | ORAL_TABLET | Freq: Every day | ORAL | Status: DC
  Administered 2015-09-25: 20 mg via ORAL
  Filled 2015-09-24: qty 1

## 2015-09-24 MED ORDER — PANTOPRAZOLE SODIUM 40 MG PO TBEC
40.0000 mg | DELAYED_RELEASE_TABLET | Freq: Every day | ORAL | Status: DC
  Administered 2015-09-24 – 2015-09-29 (×6): 40 mg via ORAL
  Filled 2015-09-24 (×6): qty 1

## 2015-09-24 MED ORDER — HYDROCODONE-ACETAMINOPHEN 5-325 MG PO TABS
1.0000 | ORAL_TABLET | Freq: Four times a day (QID) | ORAL | Status: DC | PRN
  Administered 2015-09-24 – 2015-09-25 (×2): 2 via ORAL
  Filled 2015-09-24: qty 1
  Filled 2015-09-24 (×2): qty 2

## 2015-09-24 MED ORDER — FAMOTIDINE 20 MG PO TABS
20.0000 mg | ORAL_TABLET | Freq: Every day | ORAL | Status: DC
Start: 2015-09-24 — End: 2015-09-29
  Administered 2015-09-24 – 2015-09-29 (×7): 20 mg via ORAL
  Filled 2015-09-24 (×7): qty 1

## 2015-09-24 MED ORDER — MORPHINE SULFATE (PF) 4 MG/ML IV SOLN
4.0000 mg | Freq: Once | INTRAVENOUS | Status: AC
  Administered 2015-09-24: 4 mg via INTRAVENOUS
  Filled 2015-09-24: qty 1

## 2015-09-24 MED ORDER — ALBUTEROL SULFATE HFA 108 (90 BASE) MCG/ACT IN AERS
2.0000 | INHALATION_SPRAY | Freq: Four times a day (QID) | RESPIRATORY_TRACT | Status: DC | PRN
  Filled 2015-09-24: qty 6.7

## 2015-09-24 MED ORDER — LISINOPRIL 2.5 MG PO TABS
2.5000 mg | ORAL_TABLET | Freq: Every evening | ORAL | Status: DC
Start: 2015-09-24 — End: 2015-09-26
  Administered 2015-09-24: 2.5 mg via ORAL
  Filled 2015-09-24: qty 1

## 2015-09-24 MED ORDER — LISINOPRIL 5 MG PO TABS
5.0000 mg | ORAL_TABLET | Freq: Every morning | ORAL | Status: DC
  Administered 2015-09-25: 2.5 mg via ORAL
  Filled 2015-09-24: qty 1

## 2015-09-24 MED ORDER — LISINOPRIL 5 MG PO TABS: 5.0000 mg | ORAL_TABLET | Freq: Two times a day (BID) | ORAL | Status: DC

## 2015-09-24 NOTE — ED Notes (Signed)
Bed: [id number] Expected date: 09/24/15 Expected time: 11:59 AM Means of arrival: Ambulance Comments: Hip Fx?

## 2015-09-24 NOTE — H&P (Signed)
[facility] History and Physical  [name]:[id number] DOB: [birth year] DOA: 09/24/2015  Referring physician: ER physician: Dr. Donnetta HutchingBrian Cook PCP: Greg CutterPamela Panchikal PA  Chief Complaint: fall  HPI:  82 year old female with past medical history significant for hypertension, dementia who [facility] status post mechanical fall after trying to get from wheelchair to walker. Patient is not a very good historian because of her dementia. Her daughter at the bedside provided most of the history. There was no report of lightheadedness or loss of consciousness before or after the fall. No reports of chest pain or palpitations or shortness of breath. Patient actually lives independently at home and gets help from the family. No reports of fevers. No diarrhea or constipation. No blood in the stool or urine.  In ED, patient was hemodynamically stable. Her blood work was relatively unremarkable. Plain films demonstrated right intertrochanteric hip fracture. Patient was seen by orthopedic surgery the plan is for surgery later on today [name].  Assessment & Plan    Active Problems: Right intertrochanteric fracture / fall - Status post mechanical fall - Appreciate orthopedic surgery seeing the patient in consultation. Plan for surgery today. - Continue IV fluids for hydration - Continue pain management efforts - SCDs bilaterally for now and after surgery per orthopedic surgery recommendations - PT evaluation status post surgery  Essential hypertension - Resume medications per home regimen and dose, Lasix, lisinopril metoprolol - Current blood pressure 184/90 but patient did not take [name] dose antihypertensives.   DVT prophylaxis:  - SCDs bilaterally  Radiological Exams on Admission: Dg Hip Unilat  With Pelvis 2-3 Views Right 09/24/2015   1. Acute low right transverse intertrochanteric fracture with separate lesser trochanter fragment. 2.  Degenerative arthropathy of the right hip. 3. Left hip prosthesis. 4. Healed deformity in the left pubic rami. Electronically Signed   By: Isabella RongWalter  Liebkemann M.D.   On: 09/24/2015 13:58    EKG: I have personally reviewed EKG. EKG shows sinus rhythm  Code Status: Full Family Communication: Plan of care discussed with the patient's daughter at the bedside Disposition Plan: Admit for further evaluation, medical floor, [name] per orthopedic surgery request  Manson PasseyEVINE, Isabella Bernard, MD  [facility] Pager [phone]  Time spent in minutes: 55 minutes  Review of Systems:  Constitutional: Negative for fever, chills and malaise/fatigue. Negative for diaphoresis.  HENT: Negative for hearing loss, ear pain, nosebleeds, congestion, sore throat, neck pain, tinnitus and ear discharge.   Eyes: Negative for blurred vision, double vision, photophobia, pain, discharge and redness.  Respiratory: Negative for cough, hemoptysis, sputum production, shortness of breath, wheezing and stridor.   Cardiovascular: Negative for chest pain, palpitations, orthopnea, claudication and leg swelling.  Gastrointestinal: Negative for nausea, vomiting and abdominal pain. Negative for heartburn, constipation, blood in stool and melena.  Genitourinary: Negative for dysuria, urgency, frequency, hematuria and flank pain.  Musculoskeletal: Positive for fall Skin: Negative for itching and rash.  Neurological: Negative for dizziness and weakness. Negative for tingling, tremors, sensory change, speech change, focal weakness, loss of consciousness and headaches.  Endo/Heme/Allergies: Negative for environmental allergies and polydipsia. Does not bruise/bleed easily.  Psychiatric/Behavioral: Negative for suicidal ideas. The patient is not nervous/anxious.      Past Medical History  Diagnosis Date  . Asthma   . Hypertension    Past Surgical History  Procedure Laterality Date  . Hip surgery Left    Social History:  reports that  she has never smoked. She does not  have any smokeless tobacco history on file. She reports that she does not drink alcohol or use illicit drugs.  Allergies  Allergen Reactions  . Phenergan [Promethazine Hcl]     Family History: Hypertension in mother   Prior to Admission medications   Medication Sig Start Date End Date Taking? Authorizing Provider  acetaminophen (TYLENOL) 650 MG CR tablet Take 650 mg by mouth every 8 (eight) hours as needed for pain.   Yes Historical Provider, MD  albuterol (PROVENTIL HFA;VENTOLIN HFA) 108 (90 Base) MCG/ACT inhaler Inhale 2 puffs into the lungs every 6 (six) hours as needed for wheezing or shortness of breath.   Yes Historical Provider, MD  furosemide (LASIX) 20 MG tablet Take 20 mg by mouth daily.   Yes Historical Provider, MD  lisinopril (PRINIVIL,ZESTRIL) 5 MG tablet Take 5 mg by mouth 2 (two) times daily. Take 1 tablet (5 mg) in the am and Take 0.5 tablet (2.5 mg) in the pm   Yes Historical Provider, MD  metoprolol tartrate (LOPRESSOR) 25 MG tablet Take 12.5 mg by mouth 2 (two) times daily.   Yes Historical Provider, MD  omeprazole (PRILOSEC) 40 MG capsule Take 40 mg by mouth daily.   Yes Historical Provider, MD  Probiotic Product (PROBIOTIC PO) Take 1 capsule by mouth daily.   Yes Historical Provider, MD  ranitidine (ZANTAC) 300 MG tablet Take 300 mg by mouth at bedtime.   Yes Historical Provider, MD   Physical Exam: Filed Vitals:   09/24/15 1230 09/24/15 1245 09/24/15 1246 09/24/15 1313  BP:   170/87 162/85  Pulse: 73 75 81 72  Temp:      TempSrc:      Resp: 15   17  SpO2: 98% 98% 99% 95%    Physical Exam  Constitutional: Appears well-developed and well-nourished. No distress.  HENT: Normocephalic. No tonsillar erythema or exudates Eyes: Conjunctivae are normal. No scleral icterus.  Neck: Normal ROM. Neck supple. No JVD. No tracheal deviation. No thyromegaly.  CVS: RRR, S1/S2 +, no murmurs, no gallops, no carotid bruit.  Pulmonary: Effort  and breath sounds normal, no stridor, rhonchi, wheezes, rales.  Abdominal: Soft. BS +,  no distension, tenderness, rebound or guarding.  Musculoskeletal: No edema, tender on right site, palpable pulses  Lymphadenopathy: No lymphadenopathy noted, cervical, inguinal. Neuro: Alert. Normal reflexes, muscle tone coordination. No focal neurologic deficits. Skin: Skin is warm and dry. No rash noted.  No erythema. No pallor.  Psychiatric: Normal mood and affect. Behavior normal.   Labs on Admission:  Basic Metabolic Panel:  Recent Labs Lab 09/24/15 1230  NA 144  K 4.2  CL 102  CO2 31  GLUCOSE 137*  BUN 29*  CREATININE 0.99  CALCIUM 9.7   Liver Function Tests: No results for input(s): AST, ALT, ALKPHOS, BILITOT, PROT, ALBUMIN in the last 168 hours. No results for input(s): LIPASE, AMYLASE in the last 168 hours. No results for input(s): AMMONIA in the last 168 hours. CBC:  Recent Labs Lab 09/24/15 1230  WBC 8.1  NEUTROABS 7.2  HGB 12.5  HCT 39.4  MCV 101.0*  PLT 271   Cardiac Enzymes: No results for input(s): CKTOTAL, CKMB, CKMBINDEX, TROPONINI in the last 168 hours. BNP: Invalid input(s): POCBNP CBG: No results for input(s): GLUCAP in the last 168 hours.  If 7PM-7AM, please contact night-coverage [email or web address] Password [email or web address] 09/24/2015, 2:17 PM

## 2015-09-24 NOTE — Progress Notes (Signed)
Orthopedic Tech Progress Note Patient Details:  [name] August 07, 1914 [id number]  Musculoskeletal Traction Type of Traction: Bucks Skin Traction Traction Location: RLE Traction Weight: 10 lbs    [name], [name] Craig 09/24/2015, 4:43 PM

## 2015-09-24 NOTE — ED Notes (Signed)
Ortho MD at bedside.

## 2015-09-24 NOTE — Consult Note (Signed)
No primary care provider on file. Chief Complaint: Right hip fracture History: Accidental mechanical fall while transferring from wheelchair to walker earlier today. Pt now complains of right hip pain. No head or neck trauma. Patient lives independently at home with help from her family. No other complaints at this time. Family reports normal mental status. Past Medical History  Diagnosis Date  . Asthma   . Hypertension     Allergies  Allergen Reactions  . Phenergan [Promethazine Hcl]     No current facility-administered medications on file prior to encounter.   No current outpatient prescriptions on file prior to encounter.    Physical Exam: Filed Vitals:   09/24/15 1246 09/24/15 1313  BP: 170/87 162/85  Pulse: 81 72  Temp:    Resp:  17  No soc/cp Lungs CTA abd soft/nt Pain/tenderness right groin.  No laceration/abrasion 1+ DP/PT pulses bilaterally No knee/ankle pain with palpation Compartments soft/NT EHL/TA/GA intact bilaterally   Image: Dg Hip Unilat  With Pelvis 2-3 Views Right  09/24/2015  CLINICAL DATA:  Follow onto right hip.  Right hip pain. EXAM: DG HIP (WITH OR WITHOUT PELVIS) 2-3V RIGHT COMPARISON:  None. FINDINGS: Transverse right hip intertrochanteric fracture extends transversely at the level of the lesser trochanter, mild varus angulation, separate lesser trochanter fracture. Because this is not a pure shaft fracture I classify it as intertrochanteric rather than sub trochanteric. Severe axial loss of articular space in the right hip. Associated degenerative spurring. Prior left hip prosthesis. Deformity of the left inferior pubic ramus likely from old fracture. Central calcification in the pelvis likely from calcified fibroid. Other vascular calcifications are present. Bony demineralization. IMPRESSION: 1. Acute low right transverse intertrochanteric fracture with separate lesser trochanter fragment. 2. Degenerative arthropathy of the right hip. 3. Left hip  prosthesis. 4. Healed deformity in the left pubic rami. Electronically Signed   By: Walter  Liebkemann M.D.   On: 09/24/2015 13:58    A/P:  82 yr old s/p fall with right hip pain, inability to walk.  Xrays demonstrate complex right inter-troch fracture of the hip. Spoke with my partner Dr Swinteck - will take over definitive fracture management in AM Transfer to medical service at [facility] - if cleared then plan on surgery Monday early afternoon.  

## 2015-09-24 NOTE — Progress Notes (Signed)
Physical Therapy Discharge Patient Details Name: Isabella MorningCarrie N Pensyl MRN: [id number] DOB: 1915/05/18 Today's Date: 09/24/2015 Time:  - 15:35    Patient discharged from PT services secondary to hip fracture . [facility]. Will need post op orders.       GP     Sharen HeckHill, Cadyn Fann Elizabeth Zoelle Markus PT [phone]  09/24/2015, 3:35 PM

## 2015-09-24 NOTE — ED Notes (Addendum)
Per EMS. Pt from home. Pt lost balance when transferring from wheelchair to walker. Pt fell on R hip. Did not hit head. Pt complain of R hip pain. Pt has internal rotation. Hx of hip surgery, but unsure which side. Pt's family did not want EMS giving her pain medication during transport. Family reports pt is oriented per norm. Pt hard of hearing.

## 2015-09-24 NOTE — ED Provider Notes (Signed)
CSN: [id number]     Arrival date & time 09/24/15  1157 History   First MD Initiated Contact with Patient 09/24/15 1208     Chief Complaint  Patient presents with  . Hip Pain  . Fall     (Consider location/radiation/quality/duration/timing/severity/associated sxs/prior Treatment) HPI...Marland KitchenMarland KitchenLevel V caveat for mild dementia. Accidental mechanical fall while transferring from wheelchair to walker earlier today.  Pt now complains of right hip pain. No head or neck trauma. Patient lives independently at home with help from her family. No other complaints at this time. Family reports normal mental status.  Past Medical History  Diagnosis Date  . Asthma   . Hypertension    Past Surgical History  Procedure Laterality Date  . Hip surgery Left    History reviewed. No pertinent family history. Social History  Substance Use Topics  . Smoking status: Never Smoker   . Smokeless tobacco: None  . Alcohol Use: No   OB History    No data available     Review of Systems  Reason unable to perform ROS: Mild dementia.      Allergies  Phenergan  Home Medications   Prior to Admission medications   Medication Sig Start Date End Date Taking? Authorizing Provider  acetaminophen (TYLENOL) 650 MG CR tablet Take 650 mg by mouth every 8 (eight) hours as needed for pain.   Yes Historical Provider, MD  albuterol (PROVENTIL HFA;VENTOLIN HFA) 108 (90 Base) MCG/ACT inhaler Inhale 2 puffs into the lungs every 6 (six) hours as needed for wheezing or shortness of breath.   Yes Historical Provider, MD  furosemide (LASIX) 20 MG tablet Take 20 mg by mouth daily.   Yes Historical Provider, MD  lisinopril (PRINIVIL,ZESTRIL) 5 MG tablet Take 5 mg by mouth 2 (two) times daily. Take 1 tablet (5 mg) in the am and Take 0.5 tablet (2.5 mg) in the pm   Yes Historical Provider, MD  metoprolol tartrate (LOPRESSOR) 25 MG tablet Take 12.5 mg by mouth 2 (two) times daily.   Yes Historical Provider, MD  omeprazole (PRILOSEC)  40 MG capsule Take 40 mg by mouth daily.   Yes Historical Provider, MD  Probiotic Product (PROBIOTIC PO) Take 1 capsule by mouth daily.   Yes Historical Provider, MD  ranitidine (ZANTAC) 300 MG tablet Take 300 mg by mouth at bedtime.   Yes Historical Provider, MD   BP 162/85 mmHg  Pulse 72  Temp(Src) 98.3 F (36.8 C) (Oral)  Resp 17  SpO2 95% Physical Exam  Constitutional: She is oriented to person, place, and time. She appears well-developed and well-nourished.  HENT:  Head: Normocephalic and atraumatic.  Eyes: Conjunctivae and EOM are normal. Pupils are equal, round, and reactive to light.  Neck: Normal range of motion. Neck supple.  Cardiovascular: Normal rate and regular rhythm.   Pulmonary/Chest: Effort normal and breath sounds normal.  Abdominal: Soft. Bowel sounds are normal.  Musculoskeletal:  Tender right lateral hip  Neurological: She is alert and oriented to person, place, and time.  Skin: Skin is warm and dry.  Psychiatric: She has a normal mood and affect. Her behavior is normal.  Nursing note and vitals reviewed.   ED Course  Procedures (including critical care time) Labs Review Labs Reviewed  BASIC METABOLIC PANEL - Abnormal; Notable for the following:    Glucose, Bld 137 (*)    BUN 29 (*)    GFR calc non Af Amer 46 (*)    GFR calc Af Amer 53 (*)  All other components within normal limits  CBC WITH DIFFERENTIAL/PLATELET - Abnormal; Notable for the following:    MCV 101.0 (*)    Lymphs Abs 0.4 (*)    All other components within normal limits    Imaging Review Dg Hip Unilat  With Pelvis 2-3 Views Right  09/24/2015  CLINICAL DATA:  Follow onto right hip.  Right hip pain. EXAM: DG HIP (WITH OR WITHOUT PELVIS) 2-3V RIGHT COMPARISON:  None. FINDINGS: Transverse right hip intertrochanteric fracture extends transversely at the level of the lesser trochanter, mild varus angulation, separate lesser trochanter fracture. Because this is not a pure shaft fracture I  classify it as intertrochanteric rather than sub trochanteric. Severe axial loss of articular space in the right hip. Associated degenerative spurring. Prior left hip prosthesis. Deformity of the left inferior pubic ramus likely from old fracture. Central calcification in the pelvis likely from calcified fibroid. Other vascular calcifications are present. Bony demineralization. IMPRESSION: 1. Acute low right transverse intertrochanteric fracture with separate lesser trochanter fragment. 2. Degenerative arthropathy of the right hip. 3. Left hip prosthesis. 4. Healed deformity in the left pubic rami. Electronically Signed   By: Gaylyn RongWalter  Liebkemann M.D.   On: 09/24/2015 13:58   I have personally reviewed and evaluated these images and lab results as part of my medical decision-making.   EKG Interpretation   Date/Time:  Sunday September 24 2015 12:12:45 EDT Ventricular Rate:  77 PR Interval:  150 QRS Duration: 96 QT Interval:  423 QTC Calculation: 479 R Axis:   74 Text Interpretation:  Sinus rhythm Low voltage, precordial leads Confirmed  by Jaquil Todt  MD, Macklen Wilhoite ([id number]) on 09/24/2015 12:50:49 PM      MDM   Final diagnoses:  Closed right hip fracture, initial encounter (HCC)   Plain films of right hip reveal a low right transverse intertrochanteric fracture with separate lesser trochanteric fragments. Discussed with Dr. Shon BatonBrooks orthopedic surgeon. [facility] under the care of the hospitalist program and ultimately Dr. [facility] for orthopedics. Discussed with Dr. Ashok PallA Devine     Donnetta HutchingBrian Khaleef Ruby, MD 09/24/15 1425

## 2015-09-24 NOTE — ED Notes (Signed)
EDP at bedside  

## 2015-09-24 NOTE — Progress Notes (Signed)
Patient npo after midnight for surgery tomorrow 09/25/15. She is hungry and requesting something to eat.

## 2015-09-24 NOTE — ED Notes (Signed)
MD at bedside. 

## 2015-09-25 ENCOUNTER — Inpatient Hospital Stay (HOSPITAL_COMMUNITY): Payer: Medicare Other

## 2015-09-25 ENCOUNTER — Inpatient Hospital Stay (HOSPITAL_COMMUNITY): Payer: Medicare Other | Admitting: Anesthesiology

## 2015-09-25 ENCOUNTER — Encounter (HOSPITAL_COMMUNITY): Payer: Self-pay | Admitting: Certified Registered"

## 2015-09-25 ENCOUNTER — Encounter (HOSPITAL_COMMUNITY)
Admission: EM | Disposition: A | Discharge status: Skilled Nursing Facility | Payer: Self-pay | Source: Home / Self Care | Attending: Family Medicine

## 2015-09-25 DIAGNOSIS — S72141A Displaced intertrochanteric fracture of right femur, initial encounter for closed fracture: Secondary | ICD-10-CM | POA: Diagnosis present

## 2015-09-25 HISTORY — PX: FEMUR IM NAIL: SHX1597

## 2015-09-25 LAB — CBC
HEMATOCRIT: 31.1 % — AB (ref 36.0–46.0)
Hemoglobin: 10.1 g/dL — ABNORMAL LOW (ref 12.0–15.0)
MCH: 32.5 pg (ref 26.0–34.0)
MCHC: 32.5 g/dL (ref 30.0–36.0)
MCV: 100 fL (ref 78.0–100.0)
PLATELETS: 166 10*3/uL (ref 150–400)
RBC: 3.11 MIL/uL — ABNORMAL LOW (ref 3.87–5.11)
RDW: 13.4 % (ref 11.5–15.5)
WBC: 11.5 10*3/uL — AB (ref 4.0–10.5)

## 2015-09-25 LAB — GLUCOSE, CAPILLARY
GLUCOSE-CAPILLARY: 102 mg/dL — AB (ref 65–99)
Glucose-Capillary: 111 mg/dL — ABNORMAL HIGH (ref 65–99)

## 2015-09-25 LAB — CREATININE, SERUM
Creatinine, Ser: 0.9 mg/dL (ref 0.44–1.00)
GFR calc non Af Amer: 51 mL/min — ABNORMAL LOW (ref >60)
GFR, EST AFRICAN AMERICAN: 59 mL/min — AB (ref >60)

## 2015-09-25 SURGERY — INSERTION, INTRAMEDULLARY ROD, FEMUR
Anesthesia: Spinal | Site: Leg Upper | Laterality: Right

## 2015-09-25 MED ORDER — FENTANYL CITRATE (PF) 100 MCG/2ML IJ SOLN: 25.0000 ug | INTRAMUSCULAR | Status: DC | PRN

## 2015-09-25 MED ORDER — MORPHINE SULFATE (PF) 2 MG/ML IV SOLN: 0.5000 mg | INTRAVENOUS | Status: DC | PRN

## 2015-09-25 MED ORDER — FENTANYL CITRATE (PF) 100 MCG/2ML IJ SOLN
INTRAMUSCULAR | Status: DC | PRN
  Administered 2015-09-25: 20 ug via INTRAVENOUS

## 2015-09-25 MED ORDER — ONDANSETRON HCL 4 MG/2ML IJ SOLN: 4.0000 mg | Freq: Four times a day (QID) | INTRAMUSCULAR | Status: DC | PRN

## 2015-09-25 MED ORDER — HYDROCODONE-ACETAMINOPHEN 5-325 MG PO TABS
1.0000 | ORAL_TABLET | Freq: Four times a day (QID) | ORAL | Status: DC | PRN
  Administered 2015-09-25: 1 via ORAL
  Filled 2015-09-25: qty 1

## 2015-09-25 MED ORDER — GLUCERNA SHAKE PO LIQD
237.0000 mL | Freq: Two times a day (BID) | ORAL | Status: DC
  Administered 2015-09-26 – 2015-09-29 (×8): 237 mL via ORAL

## 2015-09-25 MED ORDER — MENTHOL 3 MG MT LOZG: 1.0000 | LOZENGE | OROMUCOSAL | Status: DC | PRN

## 2015-09-25 MED ORDER — LACTATED RINGERS IV SOLN
INTRAVENOUS | Status: DC
  Administered 2015-09-25 (×2): via INTRAVENOUS

## 2015-09-25 MED ORDER — NEOSTIGMINE METHYLSULFATE 10 MG/10ML IV SOLN
INTRAVENOUS | Status: AC
  Filled 2015-09-25: qty 1

## 2015-09-25 MED ORDER — ENOXAPARIN SODIUM 30 MG/0.3ML ~~LOC~~ SOLN
30.0000 mg | SUBCUTANEOUS | Status: DC
  Administered 2015-09-26: 30 mg via SUBCUTANEOUS
  Filled 2015-09-25: qty 0.3

## 2015-09-25 MED ORDER — METOCLOPRAMIDE HCL 5 MG PO TABS: 5.0000 mg | ORAL_TABLET | Freq: Three times a day (TID) | ORAL | Status: DC | PRN

## 2015-09-25 MED ORDER — PROPOFOL 10 MG/ML IV BOLUS
INTRAVENOUS | Status: DC | PRN
  Administered 2015-09-25: 25 mg via INTRAVENOUS
  Administered 2015-09-25: 10 mg via INTRAVENOUS

## 2015-09-25 MED ORDER — ONDANSETRON HCL 4 MG/2ML IJ SOLN: 4.0000 mg | Freq: Once | INTRAMUSCULAR | Status: DC | PRN

## 2015-09-25 MED ORDER — METOCLOPRAMIDE HCL 5 MG/ML IJ SOLN: 5.0000 mg | Freq: Three times a day (TID) | INTRAMUSCULAR | Status: DC | PRN

## 2015-09-25 MED ORDER — PHENYLEPHRINE HCL 10 MG/ML IJ SOLN
INTRAMUSCULAR | Status: DC | PRN
  Administered 2015-09-25: 40 ug via INTRAVENOUS
  Administered 2015-09-25 (×2): 80 ug via INTRAVENOUS

## 2015-09-25 MED ORDER — ONDANSETRON HCL 4 MG/2ML IJ SOLN
4.0000 mg | Freq: Four times a day (QID) | INTRAMUSCULAR | Status: DC | PRN
  Administered 2015-09-25: 4 mg via INTRAVENOUS
  Filled 2015-09-25: qty 2

## 2015-09-25 MED ORDER — PHENOL 1.4 % MT LIQD: 1.0000 | OROMUCOSAL | Status: DC | PRN

## 2015-09-25 MED ORDER — ROCURONIUM BROMIDE 50 MG/5ML IV SOLN
INTRAVENOUS | Status: AC
  Filled 2015-09-25: qty 1

## 2015-09-25 MED ORDER — PROPOFOL 10 MG/ML IV BOLUS
INTRAVENOUS | Status: AC
  Filled 2015-09-25: qty 20

## 2015-09-25 MED ORDER — FENTANYL CITRATE (PF) 250 MCG/5ML IJ SOLN
INTRAMUSCULAR | Status: AC
  Filled 2015-09-25: qty 5

## 2015-09-25 MED ORDER — ONDANSETRON HCL 4 MG PO TABS: 4.0000 mg | ORAL_TABLET | Freq: Four times a day (QID) | ORAL | Status: DC | PRN

## 2015-09-25 MED ORDER — CEFAZOLIN SODIUM-DEXTROSE 2-3 GM-% IV SOLR
INTRAVENOUS | Status: AC
  Filled 2015-09-25: qty 50

## 2015-09-25 MED ORDER — DEXTROSE IN LACTATED RINGERS 5 % IV SOLN: INTRAVENOUS | Status: DC

## 2015-09-25 MED ORDER — GLYCOPYRROLATE 0.2 MG/ML IJ SOLN
INTRAMUSCULAR | Status: AC
  Filled 2015-09-25: qty 2

## 2015-09-25 MED ORDER — LIDOCAINE HCL (CARDIAC) 20 MG/ML IV SOLN
INTRAVENOUS | Status: AC
  Filled 2015-09-25: qty 10

## 2015-09-25 MED ORDER — BUPIVACAINE HCL (PF) 0.75 % IJ SOLN
INTRAMUSCULAR | Status: DC | PRN
  Administered 2015-09-25: 1 mL via INTRATHECAL

## 2015-09-25 MED ORDER — EPHEDRINE SULFATE 50 MG/ML IJ SOLN
INTRAMUSCULAR | Status: DC | PRN
  Administered 2015-09-25: 15 mg via INTRAVENOUS

## 2015-09-25 MED ORDER — 0.9 % SODIUM CHLORIDE (POUR BTL) OPTIME
TOPICAL | Status: DC | PRN
  Administered 2015-09-25: 1000 mL

## 2015-09-25 MED ORDER — ACETAMINOPHEN 650 MG RE SUPP: 650.0000 mg | Freq: Four times a day (QID) | RECTAL | Status: DC | PRN

## 2015-09-25 MED ORDER — CEFAZOLIN SODIUM-DEXTROSE 2-3 GM-% IV SOLR
2.0000 g | INTRAVENOUS | Status: AC
  Administered 2015-09-25: 2 g via INTRAVENOUS

## 2015-09-25 MED ORDER — CEFAZOLIN SODIUM-DEXTROSE 2-3 GM-% IV SOLR
2.0000 g | Freq: Three times a day (TID) | INTRAVENOUS | Status: AC
  Administered 2015-09-25 – 2015-09-26 (×2): 2 g via INTRAVENOUS
  Filled 2015-09-25 (×2): qty 50

## 2015-09-25 MED ORDER — ACETAMINOPHEN 325 MG PO TABS
650.0000 mg | ORAL_TABLET | Freq: Four times a day (QID) | ORAL | Status: DC | PRN
  Administered 2015-09-26 – 2015-09-29 (×11): 650 mg via ORAL
  Filled 2015-09-25 (×11): qty 2

## 2015-09-25 SURGICAL SUPPLY — 48 items
ADH SKN CLS APL DERMABOND .7 (GAUZE/BANDAGES/DRESSINGS) ×1
ALCOHOL ISOPROPYL (RUBBING) (MISCELLANEOUS) ×3 IMPLANT
BIT DRILL SHORT 4.2 (BIT) IMPLANT
BLADE HELICAL TFNA 80 (BLADE) ×1 IMPLANT
BLADE HELICAL TFNA 80MM (BLADE) ×1
BNDG COHESIVE 4X5 TAN STRL (GAUZE/BANDAGES/DRESSINGS) ×3 IMPLANT
CHLORAPREP W/TINT 26ML (MISCELLANEOUS) ×3 IMPLANT
COVER PERINEAL POST (MISCELLANEOUS) ×3 IMPLANT
COVER SURGICAL LIGHT HANDLE (MISCELLANEOUS) ×3 IMPLANT
DERMABOND ADVANCED (GAUZE/BANDAGES/DRESSINGS) ×2
DERMABOND ADVANCED .7 DNX12 (GAUZE/BANDAGES/DRESSINGS) ×2 IMPLANT
DRAPE C-ARM 42X72 X-RAY (DRAPES) ×3 IMPLANT
DRAPE C-ARMOR (DRAPES) ×3 IMPLANT
DRAPE IMP U-DRAPE 54X76 (DRAPES) ×6 IMPLANT
DRAPE STERI IOBAN 125X83 (DRAPES) ×3 IMPLANT
DRAPE U-SHAPE 47X51 STRL (DRAPES) ×6 IMPLANT
DRAPE UNIVERSAL PACK (DRAPES) ×3 IMPLANT
DRILL BIT SHORT 4.2 (BIT) ×3
DRSG MEPILEX BORDER 4X4 (GAUZE/BANDAGES/DRESSINGS) ×4 IMPLANT
DRSG PAD ABDOMINAL 8X10 ST (GAUZE/BANDAGES/DRESSINGS) ×1 IMPLANT
ELECT REM PT RETURN 9FT ADLT (ELECTROSURGICAL) ×3
ELECTRODE REM PT RTRN 9FT ADLT (ELECTROSURGICAL) ×1 IMPLANT
FACESHIELD WRAPAROUND (MASK) ×3 IMPLANT
FACESHIELD WRAPAROUND OR TEAM (MASK) ×1 IMPLANT
GLOVE BIO SURGEON STRL SZ8.5 (GLOVE) ×6 IMPLANT
GLOVE BIOGEL PI IND STRL 8.5 (GLOVE) ×1 IMPLANT
GLOVE BIOGEL PI INDICATOR 8.5 (GLOVE) ×2
GOWN STRL REUS W/ TWL LRG LVL3 (GOWN DISPOSABLE) ×1 IMPLANT
GOWN STRL REUS W/TWL 2XL LVL3 (GOWN DISPOSABLE) ×3 IMPLANT
GOWN STRL REUS W/TWL LRG LVL3 (GOWN DISPOSABLE) ×3
GUIDEWIRE 3.2X400 (WIRE) ×4 IMPLANT
KIT ROOM TURNOVER OR (KITS) ×3 IMPLANT
MANIFOLD NEPTUNE II (INSTRUMENTS) ×3 IMPLANT
MARKER SKIN DUAL TIP RULER LAB (MISCELLANEOUS) ×3 IMPLANT
NAIL CANN STR TFNA 11MM 125DEG (Nail) ×1 IMPLANT
NS IRRIG 1000ML POUR BTL (IV SOLUTION) ×3 IMPLANT
PACK GENERAL/GYN (CUSTOM PROCEDURE TRAY) ×3 IMPLANT
PAD ARMBOARD 7.5X6 YLW CONV (MISCELLANEOUS) ×6 IMPLANT
PADDING CAST ABS 4INX4YD NS (CAST SUPPLIES) ×2
PADDING CAST ABS COTTON 4X4 ST (CAST SUPPLIES) ×1 IMPLANT
REAMER ROD DEEP FLUTE 2.5X950 (INSTRUMENTS) ×2 IMPLANT
SCREW LOCK STAR 5X32 (Screw) ×2 IMPLANT
SUT MNCRL AB 3-0 PS2 27 (SUTURE) ×3 IMPLANT
SUT MON AB 2-0 CT1 27 (SUTURE) ×3 IMPLANT
SUT VIC AB 1 CT1 27 (SUTURE) ×3
SUT VIC AB 1 CT1 27XBRD ANBCTR (SUTURE) ×1 IMPLANT
TOWEL OR 17X24 6PK STRL BLUE (TOWEL DISPOSABLE) ×3 IMPLANT
TOWEL OR 17X26 10 PK STRL BLUE (TOWEL DISPOSABLE) ×3 IMPLANT

## 2015-09-25 NOTE — Discharge Instructions (Signed)
Dr. Samson FredericBrian Sofhia Ulibarri Adult Hip & Knee Specialist [facility] [address] [place], [place] [address] [phone]   POSTOPERATIVE DIRECTIONS    Hip Rehabilitation, Guidelines Following Surgery   WEIGHT BEARING Partial weight bearing with assist device as directed.  Touch Down Weight Bearing (30%)   HOME CARE INSTRUCTIONS  Remove items at home which could result in a fall. This includes throw rugs or furniture in walking pathways.  Continue medications as instructed at time of discharge.  You may have some home medications which will be placed on hold until you complete the course of blood thinner medication.  4 days after discharge, you may start showering. No tub baths or soaking your incisions. Do not put on socks or shoes without following the instructions of your caregivers.   Sit on chairs with arms. Use the chair arms to help push yourself up when arising.  Arrange for the use of a toilet seat elevator so you are not sitting low.   Walk with walker as instructed.  You may resume a sexual relationship in one month or when given the OK by your caregiver.  Use walker as long as suggested by your caregivers.  Avoid periods of inactivity such as sitting longer than an hour when not asleep. This helps prevent blood clots.  You may return to work once you are cleared by Designer, industrial/productyour surgeon.  Do not drive a car for 6 weeks or until released by your surgeon.  Do not drive while taking narcotics.  Wear elastic stockings for two weeks following surgery during the day but you may remove then at night.  Make sure you keep all of your appointments after your operation with all of your doctors and caregivers. You should call the office at the above phone number and make an appointment for approximately two weeks after the date of your surgery. Please pick up a stool softener and laxative for home use as long as you are requiring pain medications.  ICE to the affected  hip every three hours for 30 minutes at a time and then as needed for pain and swelling. Continue to use ice on the hip for pain and swelling from surgery. You may notice swelling that will progress down to the foot and ankle.  This is normal after surgery.  Elevate the leg when you are not up walking on it.   It is important for you to complete the blood thinner medication as prescribed by your doctor.  Continue to use the breathing machine which will help keep your temperature down.  It is common for your temperature to cycle up and down following surgery, especially at night when you are not up moving around and exerting yourself.  The breathing machine keeps your lungs expanded and your temperature down.  RANGE OF MOTION AND STRENGTHENING EXERCISES  These exercises are designed to help you keep full movement of your hip joint. Follow your caregiver's or physical therapist's instructions. Perform all exercises about fifteen times, three times per day or as directed. Exercise both hips, even if you have had only one joint replacement. These exercises can be done on a training (exercise) mat, on the floor, on a table or on a bed. Use whatever works the best and is most comfortable for you. Use music or television while you are exercising so that the exercises are a pleasant break in your day. This will make your life better with the exercises acting as a break in routine you  can look forward to.  °Lying on your back, slowly slide your foot toward your buttocks, raising your knee up off the floor. Then slowly slide your foot back down until your leg is straight again.  °Lying on your back spread your legs as far apart as you can without causing discomfort.  °Lying on your side, raise your upper leg and foot straight up from the floor as far as is comfortable. Slowly lower the leg and repeat.  °Lying on your back, tighten up the muscle in the front of your thigh (quadriceps muscles). You can do this by keeping  your leg straight and trying to raise your heel off the floor. This helps strengthen the largest muscle supporting your knee.  °Lying on your back, tighten up the muscles of your buttocks both with the legs straight and with the knee bent at a comfortable angle while keeping your heel on the floor.  ° °SKILLED REHAB INSTRUCTIONS: °If the patient is transferred to a skilled rehab facility following release from the hospital, a list of the current medications will be sent to the facility for the patient to continue.  When discharged from the skilled rehab facility, please have the facility set up the patient's Home Health Physical Therapy prior to being released. Also, the skilled facility will be responsible for providing the patient with their medications at time of release from the facility to include their pain medication and their blood thinner medication. If the patient is still at the rehab facility at time of the two week follow up appointment, the skilled rehab facility will also need to assist the patient in arranging follow up appointment in our office and any transportation needs. ° °MAKE SURE YOU:  °Understand these instructions.  °Will watch your condition.  °Will get help right away if you are not doing well or get worse. ° °Pick up stool softner and laxative for home use following surgery while on pain medications. °Daily dry dressing changes as needed. °In 4 days, you may remove your dressings and begin taking showers - no tub baths or soaking the incisions. °Continue to use ice for pain and swelling after surgery. °Do not use any lotions or creams on the incision until instructed by your surgeon. ° ° °

## 2015-09-25 NOTE — Progress Notes (Signed)
Asked to assume care by Dr. Shon BatonBrooks. Patient seen and examined.  A/P: RIGHT reverse obliquity IT femur fx / subtrochanteric fx -NWB RLE -bed rest for now -NPO -hold chemical anticoags -discussed treatment options with patient and grand dauighter who is a Charity fundraiserN at [employer]. We discussed R/B/A of IM fixation and the fact that she will be PWB after surgery. Patient's with this injury have 30% or greater risk of mortality at 1 year. Plan for surgery later today.  The risks, benefits, and alternatives were discussed with the patient / family. There are risks associated with the surgery including, but not limited to, problems with anesthesia (death), infection, differences in leg length/angulation/rotation, fracture of bones, loosening or failure of implants, malunion, nonunion, hematoma (blood accumulation) which may require surgical drainage, blood clots, pulmonary embolism, nerve injury (foot drop), and blood vessel injury. The patient / family understand these risks and elects to proceed.

## 2015-09-25 NOTE — Progress Notes (Signed)
Utilization review completed. Maycen Degregory, RN, BSN. 

## 2015-09-25 NOTE — Transfer of Care (Signed)
Immediate Anesthesia Transfer of Care Note  Patient: [name]  Procedure(s) Performed: Procedure(s): INTRAMEDULLARY (IM) NAIL FEMORAL (Right)  Patient Location: PACU  Anesthesia Type:Spinal  Level of Consciousness: awake, oriented and patient cooperative  Airway & Oxygen Therapy: Patient Spontanous Breathing and Patient connected to nasal cannula oxygen  Post-op Assessment: Report given to RN, Post -op Vital signs reviewed and stable and Patient moving all extremities  Post vital signs: Reviewed and stable  Last Vitals:  Filed Vitals:   09/25/15 0707 09/25/15 1521  BP: 155/66 103/85  Pulse: 83 71  Temp: 36.7 C 36.7 C  Resp: 16 14    Complications: No apparent anesthesia complications

## 2015-09-25 NOTE — H&P (View-Only) (Signed)
No primary care provider on file. Chief Complaint: Right hip fracture History: Accidental mechanical fall while transferring from wheelchair to walker earlier today. Pt now complains of right hip pain. No head or neck trauma. Patient lives independently at home with help from her family. No other complaints at this time. Family reports normal mental status. Past Medical History  Diagnosis Date  . Asthma   . Hypertension     Allergies  Allergen Reactions  . Phenergan [Promethazine Hcl]     No current facility-administered medications on file prior to encounter.   No current outpatient prescriptions on file prior to encounter.    Physical Exam: Filed Vitals:   09/24/15 1246 09/24/15 1313  BP: 170/87 162/85  Pulse: 81 72  Temp:    Resp:  17  No soc/cp Lungs CTA abd soft/nt Pain/tenderness right groin.  No laceration/abrasion 1+ DP/PT pulses bilaterally No knee/ankle pain with palpation Compartments soft/NT EHL/TA/GA intact bilaterally   Image: Dg Hip Unilat  With Pelvis 2-3 Views Right  09/24/2015  CLINICAL DATA:  Follow onto right hip.  Right hip pain. EXAM: DG HIP (WITH OR WITHOUT PELVIS) 2-3V RIGHT COMPARISON:  None. FINDINGS: Transverse right hip intertrochanteric fracture extends transversely at the level of the lesser trochanter, mild varus angulation, separate lesser trochanter fracture. Because this is not a pure shaft fracture I classify it as intertrochanteric rather than sub trochanteric. Severe axial loss of articular space in the right hip. Associated degenerative spurring. Prior left hip prosthesis. Deformity of the left inferior pubic ramus likely from old fracture. Central calcification in the pelvis likely from calcified fibroid. Other vascular calcifications are present. Bony demineralization. IMPRESSION: 1. Acute low right transverse intertrochanteric fracture with separate lesser trochanter fragment. 2. Degenerative arthropathy of the right hip. 3. Left hip  prosthesis. 4. Healed deformity in the left pubic rami. Electronically Signed   By: Gaylyn RongWalter  Liebkemann M.D.   On: 09/24/2015 13:58    A/P:  82 yr old s/p fall with right hip pain, inability to walk.  Xrays demonstrate complex right inter-troch fracture of the hip. Spoke with my partner Dr Linna CapriceSwinteck - will take over definitive fracture management in AM [facility] - if cleared then plan on surgery Monday early afternoon.

## 2015-09-25 NOTE — Brief Op Note (Signed)
09/24/2015 - 09/25/2015  5:10 PM  PATIENT:  [name]  82 y.o. female  PRE-OPERATIVE DIAGNOSIS:  Right Femoral Intertrochanteric Fracture  POST-OPERATIVE DIAGNOSIS:  Right Femoral Intertrochanteric Fracture  PROCEDURE:  Procedure(s): INTRAMEDULLARY (IM) NAIL FEMORAL (Right)  SURGEON:  Surgeon(s) and Role:    * Samson FredericBrian Mykenna Viele, MD - Primary  PHYSICIAN ASSISTANT: none  ASSISTANTS: Hart CarwinJustin Queen, RNFA  ANESTHESIA:   spinal  EBL:  Total I/O In: 2205.8 [I.V.:2205.8] Out: 100 [Blood:100]  BLOOD ADMINISTERED:none  DRAINS: none   LOCAL MEDICATIONS USED:  NONE  SPECIMEN:  No Specimen  DISPOSITION OF SPECIMEN:  N/A  COUNTS:  YES  TOURNIQUET:  * No tourniquets in log *  DICTATION: .Other Dictation: Dictation Number [id number]  PLAN OF CARE: Admit to inpatient   PATIENT DISPOSITION:  PACU - hemodynamically stable.   Delay start of Pharmacological VTE agent (>24hrs) due to surgical blood loss or risk of bleeding: no

## 2015-09-25 NOTE — Progress Notes (Signed)
Attempted to weigh pt but bedscale had not been zeroed-per family wt is off by more than 30 pounds.

## 2015-09-25 NOTE — Anesthesia Procedure Notes (Signed)
Spinal Patient location during procedure: OR Start time: 09/25/2015 4:00 PM End time: 09/25/2015 4:19 PM Staffing Anesthesiologist: Endre Coutts Performed by: anesthesiologist  Preanesthetic Checklist Completed: patient identified, site marked, surgical consent, pre-op evaluation, timeout performed, IV checked and monitors and equipment checked Spinal Block Patient position: right lateral decubitus Prep: Betadine Patient monitoring: heart rate, cardiac monitor, continuous pulse ox and blood pressure Approach: right paramedian Location: L3-4 Needle Needle type: Quincke  Needle gauge: 25 G Needle length: 9 cm Needle insertion depth: 4 cm Assessment Sensory level: T6 Additional Notes Clear CSF return , tolerated well

## 2015-09-25 NOTE — Progress Notes (Signed)
[facility] PROGRESS NOTE  [name] ZOX:[id number] DOB: February 14, 1915 DOA: 09/24/2015 PCP: Philemon Kingdom, MD   Summary: 82 year old white female with history of hypertension cared for by family members at home. Larey Seat and sustained a right hip fracture.  Assessment/Plan:  Principal Problem:   Intertrochanteric fracture of right hip [facility]): Patient is medically optimized and has no known contraindications to surgery. Patient's granddaughter who is a Investment banker, operational at [employer] is fixated on the amount of fluids she is getting, as she had an episode of "fluid overload" 5 years ago at [facility] when admitted for vomiting. No known history of CHF, heart disease or valvular disease. She does have a murmur on exam and I see no echo report in the records. Patient's surgery is scheduled for 5:00 today. Will go ahead and check echocardiogram to help with perioperative fluid management, but does should not delay surgery. Also, check preoperative chest x-ray. She remains on Lasix and is only getting 50 mL of saline and hour currently. No evidence of volume overload currently. Will check ins and outs and daily weights. Active Problems:   Fall   Benign essential HTN: Controlled. Granddaughter denies history of dementia (mentioned in history of present illness).  Code Status:  full Family Communication:  Granddaughter, answered multiple questions and concerns. Disposition Plan:  Likely skilled nursing facility  Consultants:  Orthopedics, [place]  Procedures:     Antibiotics:    HPI/Subjective: "I don't feel well" cannot qualify further. Denies shortness of breath or chest pain.  Objective: Filed Vitals:   09/24/15 2106 09/25/15 0707  BP: 127/67 155/66  Pulse: 70 83  Temp: 98.1 F (36.7 C) 98 F (36.7 C)  Resp: 16 16    Intake/Output Summary (Last 24 hours) at 09/25/15 0846 Last data filed at 09/25/15 0800  Gross per 24 hour  Intake 1205.83 ml  Output       0 ml  Net 1205.83 ml   There were no vitals filed for this visit.  Exam:   General:  Elderly white female slightly groggy. Cooperative. Breathing nonlabored.  Cardiovascular: Regular rate rhythm with 3/6 murmur  Respiratory: Clear to auscultation bilaterally without wheezes rhonchi or rales  Abdomen: Soft nontender nondistended  Ext: No clubbing cyanosis or edema  Basic Metabolic Panel:  Recent Labs Lab 09/24/15 1230  NA 144  K 4.2  CL 102  CO2 31  GLUCOSE 137*  BUN 29*  CREATININE 0.99  CALCIUM 9.7   Liver Function Tests: No results for input(s): AST, ALT, ALKPHOS, BILITOT, PROT, ALBUMIN in the last 168 hours. No results for input(s): LIPASE, AMYLASE in the last 168 hours. No results for input(s): AMMONIA in the last 168 hours. CBC:  Recent Labs Lab 09/24/15 1230  WBC 8.1  NEUTROABS 7.2  HGB 12.5  HCT 39.4  MCV 101.0*  PLT 271   Cardiac Enzymes: No results for input(s): CKTOTAL, CKMB, CKMBINDEX, TROPONINI in the last 168 hours. BNP (last 3 results) No results for input(s): BNP in the last 8760 hours.  ProBNP (last 3 results) No results for input(s): PROBNP in the last 8760 hours.  CBG:  Recent Labs Lab 09/24/15 1647 09/24/15 2103 09/25/15 0706  GLUCAP 117* 109* 111*    No results found for this or any previous visit (from the past 240 hour(s)).   Studies: Dg Hip Unilat  With Pelvis 2-3 Views Right  09/24/2015  CLINICAL DATA:  Follow onto right hip.  Right hip pain. EXAM: DG HIP (WITH OR WITHOUT  PELVIS) 2-3V RIGHT COMPARISON:  None. FINDINGS: Transverse right hip intertrochanteric fracture extends transversely at the level of the lesser trochanter, mild varus angulation, separate lesser trochanter fracture. Because this is not a pure shaft fracture I classify it as intertrochanteric rather than sub trochanteric. Severe axial loss of articular space in the right hip. Associated degenerative spurring. Prior left hip prosthesis. Deformity of the  left inferior pubic ramus likely from old fracture. Central calcification in the pelvis likely from calcified fibroid. Other vascular calcifications are present. Bony demineralization. IMPRESSION: 1. Acute low right transverse intertrochanteric fracture with separate lesser trochanter fragment. 2. Degenerative arthropathy of the right hip. 3. Left hip prosthesis. 4. Healed deformity in the left pubic rami. Electronically Signed   By: Gaylyn RongWalter  Liebkemann M.D.   On: 09/24/2015 13:58   Dg Femur Port, Min 2 Views Right  09/25/2015  CLINICAL DATA:  Preoperative right hip fracture EXAM: RIGHT FEMUR PORTABLE 1 VIEW COMPARISON:  None. FINDINGS: Comminuted inter trochanteric fractures of the proximal right femur with varus angulation of the fracture fragments. There is medial and superior displacement of the distal femoral shaft fragment. Displaced lesser trochanteric fragment. Degenerative changes in the right knee. Vascular calcifications. IMPRESSION: Comminuted inter trochanteric fractures of the proximal right femur with varus angulation. Electronically Signed   By: Burman NievesWilliam  Stevens M.D.   On: 09/25/2015 01:34    Scheduled Meds: . famotidine  20 mg Oral Daily  . furosemide  20 mg Oral Daily  . lisinopril  5 mg Oral q morning - 10a   And  . lisinopril  2.5 mg Oral QPM  . metoprolol tartrate  12.5 mg Oral BID  . pantoprazole  40 mg Oral Daily  . saccharomyces boulardii  250 mg Oral Daily   Continuous Infusions: . sodium chloride 50 mL/hr at 09/25/15 0732    Time spent: 25 minutes  Devere Brem L  [facility]  [email or web address], password [facility] 09/25/2015, 8:46 AM  LOS: 1 day

## 2015-09-25 NOTE — Progress Notes (Signed)
Initial Nutrition Assessment  DOCUMENTATION CODES:   Not applicable  INTERVENTION:  Once diet advances, provide Glucerna Shake po BID, each supplement provides 220 kcal and 10 grams of protein.  NUTRITION DIAGNOSIS:   Increased nutrient needs related to  (surgery) as evidenced by estimated needs.  GOAL:   Patient will meet greater than or equal to 90% of their needs  MONITOR:   Diet advancement, Supplement acceptance, Weight trends, I & O's, Labs, Skin  REASON FOR ASSESSMENT:   Consult Assessment of nutrition requirement/status  ASSESSMENT:   82 year old female with past medical history significant for hypertension, dementia who [facility] status post mechanical fall after trying to get from wheelchair to walker. Plain films demonstrated right intertrochanteric hip fracture. Patient was seen by orthopedic surgery the plan is [facility] on [facility].  Family at bedside. They reports pt was eating fine PTA with consumption of at least 3 meals a day with no other difficulties. Family reports pt does consume a Boost glucose control shake at least once daily. RD to order Glucerna once diet advances. Noted no new weight or height recorded. Family reports pt's height is 4 feet 9 inches and usual body weight is ~108 lbs.   Nutrition-Focused physical exam completed. Findings are moderate fat depletion, moderate muscle depletion, and no edema. Depletion is likely associated with the natural aging process.   Labs and medications reviewed.   Diet Order:  Diet NPO time specified  Skin:  Reviewed, no issues  Last BM:  3/18  Height:   Ht Readings from Last 1 Encounters:  No data found for Ht  Family reports 4 feet 9 inches  Weight:   Wt Readings from Last 1 Encounters:  No data found for Wt  Usual body weight ~108 lbs.   Ideal Body Weight:  43 kg  BMI:  There is no height or weight on file to calculate BMI.  Estimated  Nutritional Needs:   Kcal:  1350-1550  Protein:  55-65  Fluid:  >/= 1.5 L/day  EDUCATION NEEDS:   No education needs identified at this time  Roslyn SmilingStephanie Tylea Hise, MS, RD, LDN Pager # [phone] After hours/ weekend pager # [phone]

## 2015-09-25 NOTE — Interval H&P Note (Signed)
History and Physical Interval Note:  09/25/2015 3:41 PM  [name]  has presented today for surgery, with the diagnosis of Right Femoral Intertrochanteric Fracture  The various methods of treatment have been discussed with the patient and family. After consideration of risks, benefits and other options for treatment, the patient has consented to  Procedure(s): INTRAMEDULLARY (IM) NAIL FEMORAL (Right) as a surgical intervention .  The patient's history has been reviewed, patient examined, no change in status, stable for surgery.  I have reviewed the patient's chart and labs.  Questions were answered to the patient's satisfaction.     Dorothy Polhemus, Cloyde ReamsBrian James

## 2015-09-25 NOTE — Anesthesia Postprocedure Evaluation (Signed)
Anesthesia Post Note  Patient: [name]  Procedure(s) Performed: Procedure(s) (LRB): INTRAMEDULLARY (IM) NAIL FEMORAL (Right)  Patient location during evaluation: PACU Anesthesia Type: Spinal Level of consciousness: oriented and awake and alert Pain management: pain level controlled Vital Signs Assessment: post-procedure vital signs reviewed and stable Respiratory status: spontaneous breathing, respiratory function stable and patient connected to nasal cannula oxygen Cardiovascular status: blood pressure returned to baseline and stable Postop Assessment: no headache, no backache and spinal receding Anesthetic complications: no    Last Vitals:  Filed Vitals:   09/25/15 1814 09/25/15 1815  BP: 128/66   Pulse: 111 109  Temp:    Resp: 15 19    Last Pain:  Filed Vitals:   09/25/15 1820  PainSc: 0-No pain    LLE Motor Response: No movement due to regional block (09/25/15 1815)   RLE Motor Response: No movement due to regional block (09/25/15 1815)        Deija Buhrman,JAMES TERRILL

## 2015-09-25 NOTE — Anesthesia Preprocedure Evaluation (Addendum)
Anesthesia Evaluation  Patient identified by MRN, date of birth, ID band Patient awake    Reviewed: Allergy & Precautions, NPO status , Patient's Chart, lab work & pertinent test results  Airway Mallampati: II   Neck ROM: Limited    Dental  (+) Teeth Intact   Pulmonary asthma ,    breath sounds clear to auscultation       Cardiovascular hypertension,  Rhythm:Regular Rate:Normal + Systolic murmurs    Neuro/Psych    GI/Hepatic Neg liver ROS, hiatal hernia, GERD  ,  Endo/Other  negative endocrine ROS  Renal/GU negative Renal ROS     Musculoskeletal negative musculoskeletal ROS (+)   Abdominal   Peds  Hematology negative hematology ROS (+)   Anesthesia Other Findings   Reproductive/Obstetrics                            Anesthesia Physical Anesthesia Plan  ASA: III  Anesthesia Plan: Spinal   Post-op Pain Management:    Induction:   Airway Management Planned: Natural Airway, Nasal Cannula and Simple Face Mask  Additional Equipment:   Intra-op Plan:   Post-operative Plan:   Informed Consent: I have reviewed the patients History and Physical, chart, labs and discussed the procedure including the risks, benefits and alternatives for the proposed anesthesia with the patient or authorized representative who has indicated his/her understanding and acceptance.   Dental advisory given  Plan Discussed with: CRNA and Surgeon  Anesthesia Plan Comments: (Plan discussed c daughter and granddaughter)        Anesthesia Quick Evaluation

## 2015-09-25 NOTE — Op Note (Signed)
NAMMarland Kitchen:  [name], [name]               ACCOUNT NO.:  [id number]  MEDICAL RECORD NO.:  [id number]  LOCATION:  [id number]                        FACILITY:  [facility]  PHYSICIAN:  Samson FredericBrian Erla Bacchi, MD     DATE OF BIRTH:  Jul 07, 1915  DATE OF PROCEDURE:  09/25/2015 DATE OF DISCHARGE:                              OPERATIVE REPORT   SURGEON:  Samson FredericBrian Briza Bark, M.D.  ASSISTANT:  Hart CarwinJustin Queen, RNFA.  PREOPERATIVE DIAGNOSIS:  Right reverse obliquity intertrochanteric femur fracture.  POSTOPERATIVE DIAGNOSIS:  Right reverse obliquity intertrochanteric femur fracture.  PROCEDURE PERFORMED:  Intramedullary fixation of right intertrochanteric femur fracture.  IMPLANTS: 1. Synthes TFNA size 11 x 340 mm, 125 degrees. 2. TFNA titanium helical blade, 80 mm. 3. A 5.0 x 32 mm distal interlocking screw x1.  ANTIBIOTICS:  A 2 g Ancef.  COMPLICATIONS:  None.  TUBES AND DRAINS:  None.  DISPOSITION:  Stable to PACU.  SPECIMENS:  None.  INDICATIONS:  The patient is a 82 year old female, who had a ground level fall at home.  She had right hip pain and inability to weight bear.  She was brought to the Emergency Department where x-rays revealed a displaced reverse obliquity right intertrochanteric femur fracture. She was admitted to the Hospitalist Service.  She underwent perioperative risk stratification, medical optimization.  Risks, benefits, and alternatives of surgical fixation were explained to the patient and her family.  They elected to proceed.  DESCRIPTION OF PROCEDURE IN DETAIL:  The patient was correctly identified in the holding area using 2 identifiers.  Surgical site was marked by myself.  She was taken to the operating room.  Spinal anesthesia was obtained on her bed.  [facility]  The left lower extremity was scissored underneath the right.  The fracture was reduced with standard traction, internal rotation, and adduction.  AP and lateral fluoroscopy views  were used to confirm reduction.  The right lower extremity was prepped and draped in normal sterile surgical fashion.  Time-out was called verifying side and site of surgery.  I began by making a 4-cm incision proximal to the tip of the trochanter.  I used an awl to determine the standard starting point for a trochanteric entry nail.  I placed a guide pin.  I used an entry reamer.  I then placed a guidewire down to the physeal scar of the knee.  I measured the length of the nail.  I then inserted the real nail on the jig through a separate stab incision.  I inserted the cannula for the cephalomedullary device down to bone.  I placed the guidepin in center-center position into the femoral head.  This was confirmed on AP and lateral fluoroscopy views.  I measured,  drilled, and then placed the real TFNA blade.  I tightened the set screw due to the fracture pattern.  I removed the jig.  Final AP and lateral fluoroscopy views were used to confirm acceptable fracture reduction and hardware placement.  There was no chondral penetration, and tip-apex distance was appropriate.  I turned my attention distally.  Using perfect circle technique, I placed one distal interlocking screw.  All wounds were copiously irrigated with saline.  The wounds were closed in layers with #1 Vicryl for the fascia, 2-0 Monocryl for the deep dermal layer, and staples for the skin.  Mepilex dressings were applied.  The patient was then awakened from anesthesia, transferred to bed, taken to PACU in stable condition.  Sponge, needle and instrument counts were correct at the end of the case x2.  There were no known complications.  I discussed the operative events with the patient's family.  We will readmit her to the hospitalist.  She will be touchdown weightbearing to the right lower extremity.  We will place her on Lovenox for DVT prophylaxis.  She will work with Physical and Occupational Therapy.  She will need  disposition and planning.  I will see her in the office in 2 weeks.          ______________________________ Samson Frederic, MD     BS/MEDQ  D:  09/25/2015  T:  09/25/2015  Job:  [id number]

## 2015-09-26 ENCOUNTER — Encounter (HOSPITAL_COMMUNITY): Payer: Self-pay | Admitting: Orthopedic Surgery

## 2015-09-26 ENCOUNTER — Inpatient Hospital Stay (HOSPITAL_COMMUNITY): Payer: Medicare Other

## 2015-09-26 DIAGNOSIS — I959 Hypotension, unspecified: Secondary | ICD-10-CM | POA: Diagnosis not present

## 2015-09-26 DIAGNOSIS — R410 Disorientation, unspecified: Secondary | ICD-10-CM | POA: Diagnosis not present

## 2015-09-26 DIAGNOSIS — D62 Acute posthemorrhagic anemia: Secondary | ICD-10-CM | POA: Diagnosis not present

## 2015-09-26 DIAGNOSIS — S81812A Laceration without foreign body, left lower leg, initial encounter: Secondary | ICD-10-CM | POA: Diagnosis not present

## 2015-09-26 DIAGNOSIS — R011 Cardiac murmur, unspecified: Secondary | ICD-10-CM

## 2015-09-26 LAB — CBC
HCT: 28.4 % — ABNORMAL LOW (ref 36.0–46.0)
Hemoglobin: 8.8 g/dL — ABNORMAL LOW (ref 12.0–15.0)
MCH: 30.7 pg (ref 26.0–34.0)
MCHC: 31 g/dL (ref 30.0–36.0)
MCV: 99 fL (ref 78.0–100.0)
PLATELETS: 170 10*3/uL (ref 150–400)
RBC: 2.87 MIL/uL — AB (ref 3.87–5.11)
RDW: 13.3 % (ref 11.5–15.5)
WBC: 10.4 10*3/uL (ref 4.0–10.5)

## 2015-09-26 LAB — URINALYSIS, ROUTINE W REFLEX MICROSCOPIC
BILIRUBIN URINE: NEGATIVE
GLUCOSE, UA: NEGATIVE mg/dL
KETONES UR: 15 mg/dL — AB
Nitrite: NEGATIVE
PROTEIN: NEGATIVE mg/dL
Specific Gravity, Urine: 1.025 (ref 1.005–1.030)
pH: 5 (ref 5.0–8.0)

## 2015-09-26 LAB — URINE MICROSCOPIC-ADD ON

## 2015-09-26 LAB — BASIC METABOLIC PANEL
Anion gap: 7 (ref 5–15)
BUN: 13 mg/dL (ref 6–20)
CO2: 31 mmol/L (ref 22–32)
CREATININE: 0.82 mg/dL (ref 0.44–1.00)
Calcium: 8.6 mg/dL — ABNORMAL LOW (ref 8.9–10.3)
Chloride: 100 mmol/L — ABNORMAL LOW (ref 101–111)
GFR, EST NON AFRICAN AMERICAN: 57 mL/min — AB (ref >60)
Glucose, Bld: 131 mg/dL — ABNORMAL HIGH (ref 65–99)
POTASSIUM: 3.6 mmol/L (ref 3.5–5.1)
SODIUM: 138 mmol/L (ref 135–145)

## 2015-09-26 LAB — ECHOCARDIOGRAM COMPLETE: Weight: 1921.6 oz

## 2015-09-26 LAB — HEMOGLOBIN AND HEMATOCRIT, BLOOD
HEMATOCRIT: 26.8 % — AB (ref 36.0–46.0)
HEMOGLOBIN: 8.4 g/dL — AB (ref 12.0–15.0)

## 2015-09-26 MED ORDER — SODIUM CHLORIDE 0.9 % IV BOLUS (SEPSIS)
500.0000 mL | Freq: Once | INTRAVENOUS | Status: AC
  Administered 2015-09-26: 500 mL via INTRAVENOUS

## 2015-09-26 MED ORDER — ASPIRIN EC 81 MG PO TBEC
81.0000 mg | DELAYED_RELEASE_TABLET | Freq: Two times a day (BID) | ORAL | Status: DC
  Administered 2015-09-27 – 2015-09-29 (×6): 81 mg via ORAL
  Filled 2015-09-26 (×7): qty 1

## 2015-09-26 NOTE — Progress Notes (Addendum)
[facility] PROGRESS NOTE  DOREAN HIEBERT ZOX:[id number] DOB: 13-Aug-1914 DOA: 09/24/2015 PCP: Philemon Kingdom, MD   Summary: 82 year old white female with history of hypertension cared for by family members at home. Larey Seat and sustained a right hip fracture. S/p IM nail by Dr. Veda Canning 3/20.  Overnight, patient has been confused. Blood pressure was 79/43 this morning at 0600. It appears that no provider was called.  Assessment/Plan:  Principal Problem:   Intertrochanteric fracture of right hip (HCC): s/p IM nail 3/22. On lovenox.  Hypotension: Hold antihypertensives and Lasix. Give 500 mL normal saline bolus. Repeat H&H and if lower, will transfuse. Family agrees with plan.    Fall    Benign essential HTN: Hold medications  History of "fluid overload" per family members. They remained fixated on IV fluid. Echocardiogram is pending. Chest x-ray 3/20 without signs of CHF. Continue monitoring ins and outs, daily weights. Follow-up echo report. Discussed with family risk and benefit of IV fluids in the setting of hypotension.  Acute delirium: Secondary to pain medication and sundowners. Currently cooperative and pleasantly confused.  Postoperative anemia: see above.  Granddaughter denies history of dementia (mentioned in history of present illness).  Code Status:  DO NOT RESUSCITATE per family 3/20 Family Communication:  Daughter at bedside Disposition Plan:  Likely skilled nursing facility if stabilizes in a few days.  Consultants:  Orthopedics, [place]  Procedures:   Right IM nail  Antibiotics:    HPI/Subjective: Denies dyspnea, chest pain. Denies hip pain. Family reports patient became confused postoperatively and seemed to be grasping at the air (hallucinating).  Objective: Filed Vitals:   09/26/15 0200 09/26/15 0608  BP: 107/59 79/43  Pulse: 92 80  Temp: 98.4 F (36.9 C) 98.2 F (36.8 C)  Resp: 18 17    Intake/Output Summary (Last 24 hours) at  09/26/15 0907 Last data filed at 09/25/15 2322  Gross per 24 hour  Intake   1350 ml  Output    350 ml  Net   1000 ml   Filed Weights   09/26/15 0608  Weight: 54.477 kg (120 lb 1.6 oz)    Exam:   General:  Asleep. Arousable. Breathing nonlabored. Confused. Pleasant and cooperative.  Cardiovascular: Regular rate rhythm with 3/6 murmur  Respiratory: Clear to auscultation bilaterally without wheezes rhonchi or rales  Abdomen: Soft nontender nondistended  Ext: No clubbing cyanosis or edema. Dressing with some sanguinous saturation noted  Skin: Skin tears noted in the left pretibial area and right forearm.  Neurologic: Nonfocal.  Basic Metabolic Panel:  Recent Labs Lab 09/24/15 1230 09/25/15 2014 09/26/15 0511  NA 144  --  138  K 4.2  --  3.6  CL 102  --  100*  CO2 31  --  31  GLUCOSE 137*  --  131*  BUN 29*  --  13  CREATININE 0.99 0.90 0.82  CALCIUM 9.7  --  8.6*   Liver Function Tests: No results for input(s): AST, ALT, ALKPHOS, BILITOT, PROT, ALBUMIN in the last 168 hours. No results for input(s): LIPASE, AMYLASE in the last 168 hours. No results for input(s): AMMONIA in the last 168 hours. CBC:  Recent Labs Lab 09/24/15 1230 09/25/15 2014 09/26/15 0511  WBC 8.1 11.5* 10.4  NEUTROABS 7.2  --   --   HGB 12.5 10.1* 8.8*  HCT 39.4 31.1* 28.4*  MCV 101.0* 100.0 99.0  PLT 271 166 170   Cardiac Enzymes: No results for input(s): CKTOTAL, CKMB, CKMBINDEX, TROPONINI in the last 168 hours.  BNP (last 3 results) No results for input(s): BNP in the last 8760 hours.  ProBNP (last 3 results) No results for input(s): PROBNP in the last 8760 hours.  CBG:  Recent Labs Lab 09/24/15 1647 09/24/15 2103 09/25/15 0706 09/25/15 1155  GLUCAP 117* 109* 111* 102*    No results found for this or any previous visit (from the past 240 hour(s)).   Studies: Dg Chest 1 View  09/25/2015  CLINICAL DATA:  Fall, right hip pain, preop chest EXAM: CHEST 1 VIEW COMPARISON:   03/09/2013 FINDINGS: Lungs are clear.  No pleural effusion or pneumothorax. Stable chronic blunting of the left costophrenic angle. The heart is normal in size. Degenerative changes of the right shoulder. IMPRESSION: No evidence of acute cardiopulmonary disease. Electronically Signed   By: Charline Bills M.D.   On: 09/25/2015 13:08   Pelvis Portable  09/25/2015  CLINICAL DATA:  Status post ORIF of proximal right femoral fracture EXAM: PORTABLE PELVIS 1-2 VIEWS COMPARISON:  09/24/2015 FINDINGS: Left hip replacement is noted. Postsurgical changes in the proximal right femur are seen. Pelvic ring appears intact. Generalized osteopenia is noted. No soft tissue abnormality is seen. IMPRESSION: Status post ORIF of right femoral fracture. Electronically Signed   By: Alcide Clever M.D.   On: 09/25/2015 20:51   Dg C-arm 1-60 Min  09/25/2015  CLINICAL DATA:  ORIF right femur fracture EXAM: DG C-ARM 61-120 MIN; RIGHT FEMUR 2 VIEWS COMPARISON:  09/24/2015 right femur radiographs. FINDINGS: Fluoroscopy time 1 minutes 14 seconds. Spot fluoroscopic nondiagnostic intraoperative right femur radiographs demonstrate transfixation of the intratrochanteric right proximal femur fracture with intra medullary rod and interlocking right femoral neck pain and distal interlocking screw. IMPRESSION: Intraoperative fluoroscopic guidance for ORIF right femur fracture. Electronically Signed   By: Delbert Phenix M.D.   On: 09/25/2015 17:10   Dg Hip Unilat  With Pelvis 2-3 Views Right  09/24/2015  CLINICAL DATA:  Follow onto right hip.  Right hip pain. EXAM: DG HIP (WITH OR WITHOUT PELVIS) 2-3V RIGHT COMPARISON:  None. FINDINGS: Transverse right hip intertrochanteric fracture extends transversely at the level of the lesser trochanter, mild varus angulation, separate lesser trochanter fracture. Because this is not a pure shaft fracture I classify it as intertrochanteric rather than sub trochanteric. Severe axial loss of articular space in  the right hip. Associated degenerative spurring. Prior left hip prosthesis. Deformity of the left inferior pubic ramus likely from old fracture. Central calcification in the pelvis likely from calcified fibroid. Other vascular calcifications are present. Bony demineralization. IMPRESSION: 1. Acute low right transverse intertrochanteric fracture with separate lesser trochanter fragment. 2. Degenerative arthropathy of the right hip. 3. Left hip prosthesis. 4. Healed deformity in the left pubic rami. Electronically Signed   By: Gaylyn Rong M.D.   On: 09/24/2015 13:58   Dg Femur, Min 2 Views Right  09/25/2015  CLINICAL DATA:  ORIF right femur fracture EXAM: DG C-ARM 61-120 MIN; RIGHT FEMUR 2 VIEWS COMPARISON:  09/24/2015 right femur radiographs. FINDINGS: Fluoroscopy time 1 minutes 14 seconds. Spot fluoroscopic nondiagnostic intraoperative right femur radiographs demonstrate transfixation of the intratrochanteric right proximal femur fracture with intra medullary rod and interlocking right femoral neck pain and distal interlocking screw. IMPRESSION: Intraoperative fluoroscopic guidance for ORIF right femur fracture. Electronically Signed   By: Delbert Phenix M.D.   On: 09/25/2015 17:10   Dg Femur Port, Min 2 Views Right  09/25/2015  CLINICAL DATA:  82 year old female with history of ORIF of the right hip. EXAM: RIGHT FEMUR  PORTABLE 1 VIEW COMPARISON:  09/24/2015. FINDINGS: There is a new long stem gamma nail fixation device traversing the previously demonstrated intertrochanteric fracture of the right proximal femur. Restoration of near anatomic alignment has been achieved. There are skin staples lateral to the ileum, proximal femur and distal femur. Gas is present in the soft tissues lateral to the right hip joint. IMPRESSION: 1. Expected postoperative changes of ORIF for right femur intertrochanteric fracture, as above, without acute complicating features. Electronically Signed   By: Trudie Reedaniel  Entrikin M.D.    On: 09/25/2015 17:44   Dg Femur Port, Min 2 Views Right  09/25/2015  CLINICAL DATA:  Preoperative right hip fracture EXAM: RIGHT FEMUR PORTABLE 1 VIEW COMPARISON:  None. FINDINGS: Comminuted inter trochanteric fractures of the proximal right femur with varus angulation of the fracture fragments. There is medial and superior displacement of the distal femoral shaft fragment. Displaced lesser trochanteric fragment. Degenerative changes in the right knee. Vascular calcifications. IMPRESSION: Comminuted inter trochanteric fractures of the proximal right femur with varus angulation. Electronically Signed   By: Burman NievesWilliam  Stevens M.D.   On: 09/25/2015 01:34    Scheduled Meds: .  ceFAZolin (ANCEF) IV  2 g Intravenous Q8H  . enoxaparin (LOVENOX) injection  30 mg Subcutaneous Q24H  . famotidine  20 mg Oral Daily  . feeding supplement (GLUCERNA SHAKE)  237 mL Oral BID BM  . pantoprazole  40 mg Oral Daily  . saccharomyces boulardii  250 mg Oral Daily   Continuous Infusions: . sodium chloride 50 mL/hr at 09/25/15 0732    Critical care time 40 minutes  Aztlan Coll L  [facility]  [email or web address], password [email or web address] 09/26/2015, 9:07 AM  LOS: 2 days

## 2015-09-26 NOTE — Progress Notes (Signed)
PT Cancellation Note  Patient Details Name: Janie MorningCarrie N Gieske MRN: [id number] DOB: 1915-02-04   Cancelled Treatment:    Reason Eval/Treat Not Completed: Patient at procedure or test/unavailable   Will follow up later today as time allows;  Otherwise, will follow up for PT tomorrow;   Thank you,  Van ClinesHolly Minola Guin, PT  Acute Rehabilitation Services Pager [phone] Office [phone]     Van ClinesGarrigan, Fujie Dickison [facility] 09/26/2015, 1:44 PM

## 2015-09-26 NOTE — Progress Notes (Signed)
OT Cancellation Note  Patient Details Name: Isabella MorningCarrie N Kot MRN: [id number] DOB: July 01, 1915   Cancelled Treatment:    Reason Eval/Treat Not Completed: Patient at procedure or test/ unavailable. Pt at Echo. Will attempt tomorrow.   [facility] LLC[facility],HILLARY  Mcgregor Tinnon, OTR/L  [phone] 09/26/2015 09/26/2015, 4:03 PM

## 2015-09-26 NOTE — Progress Notes (Signed)
   Subjective:  Patient reports pain as mild to moderate.  Delirium o/n. Hypotensive this am -> hospitalist gave bolus.  Objective:   VITALS:   Filed Vitals:   09/25/15 2214 09/26/15 0200 09/26/15 0608 09/26/15 1007  BP: 130/63 107/59 79/43 83/41   Pulse: 93 92 80 84  Temp: 98.4 F (36.9 C) 98.4 F (36.9 C) 98.2 F (36.8 C)   TempSrc: Oral  Oral   Resp:  18 17   Weight:   54.477 kg (120 lb 1.6 oz)   SpO2: 100% 100% 100%     ABD soft Intact pulses distally Dorsiflexion/Plantar flexion intact Compartment soft Ss drainage from distal incision. New dressing applied.  Lab Results  Component Value Date   WBC 10.4 09/26/2015   HGB 8.8* 09/26/2015   HCT 28.4* 09/26/2015   MCV 99.0 09/26/2015   PLT 170 09/26/2015   BMET    Component Value Date/Time   NA 138 09/26/2015 0511   K 3.6 09/26/2015 0511   CL 100* 09/26/2015 0511   CO2 31 09/26/2015 0511   GLUCOSE 131* 09/26/2015 0511   BUN 13 09/26/2015 0511   CREATININE 0.82 09/26/2015 0511   CALCIUM 8.6* 09/26/2015 0511   GFRNONAA 57* 09/26/2015 0511   GFRAA >60 09/26/2015 0511     Assessment/Plan: 1 Day Post-Op   Principal Problem:   Intertrochanteric fracture of right hip (HCC) Active Problems:   Fall   Benign essential HTN   Fracture, intertrochanteric, right femur (HCC)   Hypotension   Acute delirium   Postoperative anemia due to acute blood loss   Noninfected skin tear of left leg   TDWB RLE with walker DVT ppx: will d/c lovenox due to bleeding. Start ASA 81 mg PO BID tonight. PT/OT for bed to chair xfers Hypotension per hospitalist ABLA: also hemodilution from IVFs, may need PRBCs  Dispo: D/C planning   [name], [name] 09/26/2015, 12:56 PM   Isabella FredericBrian Coral Soler, MD Cell [phone]

## 2015-09-26 NOTE — Progress Notes (Signed)
  Echocardiogram 2D Echocardiogram has been performed.  Isabella SavoyCasey N Lashika [name] 09/26/2015, 2:28 PM

## 2015-09-27 DIAGNOSIS — I10 Essential (primary) hypertension: Secondary | ICD-10-CM

## 2015-09-27 DIAGNOSIS — S72141A Displaced intertrochanteric fracture of right femur, initial encounter for closed fracture: Secondary | ICD-10-CM

## 2015-09-27 DIAGNOSIS — I9581 Postprocedural hypotension: Secondary | ICD-10-CM

## 2015-09-27 DIAGNOSIS — D62 Acute posthemorrhagic anemia: Secondary | ICD-10-CM

## 2015-09-27 LAB — BASIC METABOLIC PANEL
Anion gap: 7 (ref 5–15)
BUN: 13 mg/dL (ref 6–20)
CHLORIDE: 104 mmol/L (ref 101–111)
CO2: 27 mmol/L (ref 22–32)
Calcium: 8.4 mg/dL — ABNORMAL LOW (ref 8.9–10.3)
Creatinine, Ser: 0.79 mg/dL (ref 0.44–1.00)
GFR calc Af Amer: >60 mL/min (ref >60)
GFR calc non Af Amer: >60 mL/min (ref >60)
Glucose, Bld: 132 mg/dL — ABNORMAL HIGH (ref 65–99)
POTASSIUM: 3.8 mmol/L (ref 3.5–5.1)
SODIUM: 138 mmol/L (ref 135–145)

## 2015-09-27 LAB — CBC
HEMATOCRIT: 23.7 % — AB (ref 36.0–46.0)
HEMOGLOBIN: 7.8 g/dL — AB (ref 12.0–15.0)
MCH: 32.2 pg (ref 26.0–34.0)
MCHC: 32.9 g/dL (ref 30.0–36.0)
MCV: 97.9 fL (ref 78.0–100.0)
Platelets: 138 10*3/uL — ABNORMAL LOW (ref 150–400)
RBC: 2.42 MIL/uL — AB (ref 3.87–5.11)
RDW: 13.2 % (ref 11.5–15.5)
WBC: 7.7 10*3/uL (ref 4.0–10.5)

## 2015-09-27 MED ORDER — FUROSEMIDE 20 MG PO TABS
20.0000 mg | ORAL_TABLET | Freq: Every day | ORAL | Status: DC
  Administered 2015-09-27: 20 mg via ORAL
  Filled 2015-09-27: qty 1

## 2015-09-27 NOTE — Clinical Social Work Note (Addendum)
CSW spoke to patient's daughter Consuella Loselaine who was at bedside  and grandaughter Lanette regarding SNF placement and [facility] would [facility].  CSW contacted [facility] and they said they can take patient once she is medically ready for discharge and orders have been received.  Formal assessment to follow, physician please sign gold DNR form on patient's chart.  Ervin KnackEric R. Andrw Mcguirt, MSW, LCSWA [phone] 09/27/2015 6:00 PM

## 2015-09-27 NOTE — Progress Notes (Signed)
[facility] PROGRESS NOTE  Janie MorningCarrie N Kerby WUJ:[id number] DOB: 02/28/1915 DOA: 09/24/2015 PCP: Philemon KingdomPROCHNAU,CAROLINE, MD  Assessment/Plan: 1. Intertrochanteric fracture of right hip- status post intramedullary nail. Orthopedics following 2. History of congestive heart failure. Echocardiogram showed grade 1 diastolic dysfunction. As patient's blood pressure has improved. Will cut down the fluids to [facility] and start patient's home dose of Lasix 20 mg by mouth daily. 3. Anemia- likely dilutional from IV fluid boluses given yesterday. IV fluids have been stopped now. Patient started on Lasix. Will check CBC in a.m. 4. DVT prophylaxis- patient started on aspirin 81 mg twice a day as per orthopedics.    Code Status: DO NOT RESUSCITATE Family Communication: *Discussed with patient's daughter at bedside Disposition Plan: Skilled nursing facility when medically stable   Consultants:  Orthopedic surgery  Procedures:  Intramedullary fixation of right intertrochanteric femur fracture  Antibiotics:  None  HPI/Subjective: 82 year old female with past medical history significant for hypertension, dementia who [facility] post mechanical fall after trying to get from wheelchair to walker. Patient is not a very good historian because of her dementia. Her daughter at the bedside provided most of the history. There was no report of lightheadedness or loss of consciousness before or after the fall.  In ED, patient was hemodynamically stable. Her blood work was relatively unremarkable. Plain films demonstrated right intertrochanteric hip fracture. Patient underwent intramedullary fixation of right intertrochanteric femur fracture.  This morning patient's blood pressure has improved. She is requiring oxygen has mild respiratory distress.   Objective: Filed Vitals:   09/26/15 2300 09/27/15 0655  BP: 93/45 136/50  Pulse: 83   Temp: 98.6 F (37 C)   Resp: 16      Intake/Output Summary (Last 24 hours) at 09/27/15 1250 Last data filed at 09/27/15 1222  Gross per 24 hour  Intake 2400.83 ml  Output    850 ml  Net 1550.83 ml   Filed Weights   09/26/15 0608  Weight: 54.477 kg (120 lb 1.6 oz)    Exam:   General:  Appears in mild respiratory distress  Cardiovascular: S1-S2 normal, regular rhythm  Respiratory: Clear to auscultation bilaterally  Abdomen: Soft, nontender, no organomegaly  Musculoskeletal: No cyanosis/clubbing/edema of the lower extremities   Data Reviewed: Basic Metabolic Panel:  Recent Labs Lab 09/24/15 1230 09/25/15 2014 09/26/15 0511 09/27/15 1046  NA 144  --  138 138  K 4.2  --  3.6 3.8  CL 102  --  100* 104  CO2 31  --  31 27  GLUCOSE 137*  --  131* 132*  BUN 29*  --  13 13  CREATININE 0.99 0.90 0.82 0.79  CALCIUM 9.7  --  8.6* 8.4*   Liver Function Tests: No results for input(s): AST, ALT, ALKPHOS, BILITOT, PROT, ALBUMIN in the last 168 hours. No results for input(s): LIPASE, AMYLASE in the last 168 hours. No results for input(s): AMMONIA in the last 168 hours. CBC:  Recent Labs Lab 09/24/15 1230 09/25/15 2014 09/26/15 0511 09/26/15 1316 09/27/15 1046  WBC 8.1 11.5* 10.4  --  7.7  NEUTROABS 7.2  --   --   --   --   HGB 12.5 10.1* 8.8* 8.4* 7.8*  HCT 39.4 31.1* 28.4* 26.8* 23.7*  MCV 101.0* 100.0 99.0  --  97.9  PLT 271 166 170  --  138*    CBG:  Recent Labs Lab 09/24/15 1647 09/24/15 2103 09/25/15 0706 09/25/15 1155  GLUCAP 117* 109* 111* 102*  No results found for this or any previous visit (from the past 240 hour(s)).   Studies: Pelvis Portable  09/25/2015  CLINICAL DATA:  Status post ORIF of proximal right femoral fracture EXAM: PORTABLE PELVIS 1-2 VIEWS COMPARISON:  09/24/2015 FINDINGS: Left hip replacement is noted. Postsurgical changes in the proximal right femur are seen. Pelvic ring appears intact. Generalized osteopenia is noted. No soft tissue abnormality is seen.  IMPRESSION: Status post ORIF of right femoral fracture. Electronically Signed   By: Alcide Clever M.D.   On: 09/25/2015 20:51   Dg C-arm 1-60 Min  09/25/2015  CLINICAL DATA:  ORIF right femur fracture EXAM: DG C-ARM 61-120 MIN; RIGHT FEMUR 2 VIEWS COMPARISON:  09/24/2015 right femur radiographs. FINDINGS: Fluoroscopy time 1 minutes 14 seconds. Spot fluoroscopic nondiagnostic intraoperative right femur radiographs demonstrate transfixation of the intratrochanteric right proximal femur fracture with intra medullary rod and interlocking right femoral neck pain and distal interlocking screw. IMPRESSION: Intraoperative fluoroscopic guidance for ORIF right femur fracture. Electronically Signed   By: Delbert Phenix M.D.   On: 09/25/2015 17:10   Dg Femur, Min 2 Views Right  09/25/2015  CLINICAL DATA:  ORIF right femur fracture EXAM: DG C-ARM 61-120 MIN; RIGHT FEMUR 2 VIEWS COMPARISON:  09/24/2015 right femur radiographs. FINDINGS: Fluoroscopy time 1 minutes 14 seconds. Spot fluoroscopic nondiagnostic intraoperative right femur radiographs demonstrate transfixation of the intratrochanteric right proximal femur fracture with intra medullary rod and interlocking right femoral neck pain and distal interlocking screw. IMPRESSION: Intraoperative fluoroscopic guidance for ORIF right femur fracture. Electronically Signed   By: Delbert Phenix M.D.   On: 09/25/2015 17:10   Dg Femur Port, Min 2 Views Right  09/25/2015  CLINICAL DATA:  82 year old female with history of ORIF of the right hip. EXAM: RIGHT FEMUR PORTABLE 1 VIEW COMPARISON:  09/24/2015. FINDINGS: There is a new long stem gamma nail fixation device traversing the previously demonstrated intertrochanteric fracture of the right proximal femur. Restoration of near anatomic alignment has been achieved. There are skin staples lateral to the ileum, proximal femur and distal femur. Gas is present in the soft tissues lateral to the right hip joint. IMPRESSION: 1. Expected  postoperative changes of ORIF for right femur intertrochanteric fracture, as above, without acute complicating features. Electronically Signed   By: Trudie Reed M.D.   On: 09/25/2015 17:44    Scheduled Meds: . aspirin EC  81 mg Oral BID PC  . famotidine  20 mg Oral Daily  . feeding supplement (GLUCERNA SHAKE)  237 mL Oral BID BM  . furosemide  20 mg Oral Daily  . pantoprazole  40 mg Oral Daily  . saccharomyces boulardii  250 mg Oral Daily   Continuous Infusions: . sodium chloride 10 mL/hr at 09/27/15 1002    Principal Problem:   Intertrochanteric fracture of right hip [facility]) Active Problems:   Fall   Benign essential HTN   Fracture, intertrochanteric, right femur (HCC)   Hypotension   Acute delirium   Postoperative anemia due to acute blood loss   Noninfected skin tear of left leg    Time spent: 25 min    [facility]  [facility] Pager [phone]. If 7PM-7AM, please contact night-coverage at [email or web address], password [facility] 09/27/2015, 12:50 PM  LOS: 3 days

## 2015-09-27 NOTE — Progress Notes (Signed)
Occupational Therapy Evaluation Patient Details Name: [name] MRN: [id number] DOB: [birth year] Today's Date: 09/27/2015    History of Present Illness 82 year old female with past medical history significant for hypertension, dementia who presented to [facility] long hospital status post mechanical fall after trying to get from wheelchair to walker. was transferred to [facility] and is now s/p ORIF for R intertrochanteric hip fx.   Clinical Impression   PTA, pt required supervision for transfers and assist with bathing/dressing and IADLs from PCA or daughter and used RW or wheelchair for mobility. Pt is very sweet and determined and currently requires max +2 assist for bed mobility, sit-stand/stand-pivot transfers and demonstrates poor adherence to TDWB status of RLE. Pt SaO2 remained between 93-97% on RA at rest and during activity. Pt's BP 134/55, HR 103 during activity. Pt will benefit from continued acute OT to maximize her independence and safety with ADLs and mobility. Recommending SNF for post-acute rehab stay due to required level of assistance and lack of 24/7 assistance at home upon discharge. Will continued to follow acutely.    Follow Up Recommendations  SNF;Supervision/Assistance - 24 hour    Equipment Recommendations  Other (comment) (TBD in next venue)    Recommendations for Other Services       Precautions / Restrictions Precautions Precautions: Fall Restrictions Weight Bearing Restrictions: Yes RLE Weight Bearing: Touchdown weight bearing RLE Partial Weight Bearing Percentage or Pounds: 30      Mobility Bed Mobility Overal bed mobility: Needs Assistance;+2 for physical assistance Bed Mobility: Supine to Sit     Supine to sit: +2 for physical assistance;Max assist     General bed mobility comments: Max +2 assist to scoot hips to EOB with bed pad and for truncal support upon sitting. Mod verbal cues for hand placement and technique.  Transfers Overall transfer  level: Needs assistance Equipment used: Rolling walker (2 wheeled);2 person hand held assist Transfers: Sit to/from [employer] Sit to Stand: +2 physical assistance;Max assist Stand pivot transfers: +2 physical assistance;Max assist       General transfer comment: Max +2 assist for boost to stand with assist from bed pad for hip extension and anterior weight shift. L knee blocked for stability and verbal cues for hand placement as pt was holding onto mattress sheet during transfer and needed cues to place hand on RW and chair. Pt very fearful of falling. Poor adherence to TDWB RLE despite verbal/tactile cues    Balance Overall balance assessment: Needs assistance Sitting-balance support: No upper extremity supported;Feet supported Sitting balance-Leahy Scale: Poor Sitting balance - Comments: Pt very kyphotic but able to maintain balance after sitting EOB ~3 minutes and feet supported on floor Postural control: Posterior lean Standing balance support: Bilateral upper extremity supported;During functional activity Standing balance-Leahy Scale: Zero                              ADL Overall ADL's : Needs assistance/impaired                 Upper Body Dressing : Minimal assistance;Sitting   Lower Body Dressing: Sit to/from stand;Maximal assistance;+2 for physical assistance   Toilet Transfer: Maximal assistance;+2 for physical assistance;Cueing for safety;Cueing for sequencing;Stand-pivot;RW Toilet Transfer Details (indicate cue type and reason): simulated to chair Toileting- Clothing Manipulation and Hygiene: Maximal assistance;+2 for physical assistance;Sit to/from stand       Functional mobility during ADLs: Maximal assistance;+2 for physical assistance;Rolling walker General ADL  Comments: Pt very sweet but fearful of falling and has difficulty sequencing through mobility tasks due to fear. Pt's daughter present for OT eval.     Vision Vision  Assessment?: No apparent visual deficits Additional Comments: Will assess further if needed   Perception     Praxis      Pertinent Vitals/Pain Pain Assessment: 0-10 Pain Score: 9  Pain Location: R hip with movement Pain Descriptors / Indicators: Grimacing;Guarding;Aching Pain Intervention(s): Limited activity within patient's tolerance;Monitored during session;Repositioned     Hand Dominance Right   Extremity/Trunk Assessment Upper Extremity Assessment Upper Extremity Assessment: Generalized weakness (shoulder flexion/abduction 3-/5)   Lower Extremity Assessment Lower Extremity Assessment: Defer to PT evaluation RLE Deficits / Details: Grossly decr AROM and strength, limited by pain postop; able to activate quad and hamstrings for quad sets and heel slides RLE: Unable to fully assess due to pain   Cervical / Trunk Assessment Cervical / Trunk Assessment: Kyphotic   Communication Communication Communication: HOH   Cognition Arousal/Alertness: Awake/alert Behavior During Therapy: WFL for tasks assessed/performed Overall Cognitive Status: Within Functional Limits for tasks assessed (pt does have dementia at baseline)                     General Comments  Pt SaO2 remained between 93-97% on RA at rest and during activity. Pt's BP 134/55, HR 103 during activity. Thn film dressings on skin tears L lower leg.    Exercises       Shoulder Instructions      Home Living Family/patient expects to be discharged to:: Private residence Living Arrangements: Alone Available Help at Discharge: Family;Personal care attendant;Available PRN/intermittently Type of Home: House Home Access: Ramped entrance     Home Layout: Two level;Able to live on main level with bedroom/bathroom     Bathroom Shower/Tub: Tub/shower unit;Curtain Shower/tub characteristics: Engineer, building services: Standard Bathroom Accessibility: Yes How Accessible: Accessible via walker Home Equipment:  Walker - 2 wheels;Bedside commode;Shower seat;Hand held shower head;Wheelchair - manual;Hospital bed;Tub bench   Additional Comments: Pt has a PCA that comes 5 days/week for 5 hours and daughter assists on other 2 days. Pt spends nights alone.      Prior Functioning/Environment Level of Independence: Needs assistance  Gait / Transfers Assistance Needed: uses RW and sometimes wheelchair ADL's / Homemaking Assistance Needed: PCA/daughter assist with getting in/out of tub, bathing, dressing, and IADLs.   Comments: Walks with RW    OT Diagnosis: Generalized weakness;Acute pain;Cognitive deficits   OT Problem List: Decreased strength;Decreased range of motion;Decreased activity tolerance;Impaired balance (sitting and/or standing);Decreased coordination;Decreased safety awareness;Decreased knowledge of precautions;Decreased knowledge of use of DME or AE;Pain;Decreased cognition   OT Treatment/Interventions: Self-care/ADL training;Therapeutic exercise;Energy conservation;DME and/or AE instruction;Therapeutic activities;Cognitive remediation/compensation;Patient/family education;Balance training    OT Goals(Current goals can be found in the care plan section) Acute Rehab OT Goals Patient Stated Goal: to be able to use RW again OT Goal Formulation: With patient Time For Goal Achievement: 10/11/15 Potential to Achieve Goals: Good ADL Goals Pt Will Perform Grooming: standing;with mod assist Pt Will Perform Lower Body Bathing: with adaptive equipment;sitting/lateral leans;with mod assist;sit to/from stand Pt Will Perform Lower Body Dressing: sitting/lateral leans;with mod assist;with adaptive equipment;sit to/from stand Pt Will Transfer to Toilet: with mod assist;stand pivot transfer;bedside commode Pt Will Perform Toileting - Clothing Manipulation and hygiene: with mod assist;sitting/lateral leans;sit to/from stand  OT Frequency: Min 2X/week   Barriers to D/C: Decreased caregiver support  Is  alone at night and brief periods during  the day       Co-evaluation PT/OT/SLP Co-Evaluation/Treatment: Yes Reason for Co-Treatment: For patient/therapist safety   OT goals addressed during session: ADL's and self-care      End of Session Equipment Utilized During Treatment: Gait belt;Rolling walker Nurse Communication: Mobility status  Activity Tolerance: Patient limited by pain;Patient limited by fatigue Patient left: in chair;with call bell/phone within reach;with chair alarm set;with family/visitor present   Time: [id number] OT Time Calculation (min): 34 min Charges:  OT General Charges $OT Visit: 1 Procedure OT Evaluation $OT Eval Moderate Complexity: 1 Procedure G-Codes:    Nils PyleJulia Zolton Dowson, OTR/L Pager: [phone]: [phone] 09/27/2015, 3:56 PM

## 2015-09-27 NOTE — Care Management Important Message (Signed)
Important Message  Patient Details  Name: [name] MRN: [id number] Date of Birth: 04-11-1915   Medicare Important Message Given:  Yes    Oralia RudMegan P Sharlie Shreffler 09/27/2015, 12:33 PM

## 2015-09-27 NOTE — Evaluation (Signed)
Physical Therapy Evaluation Patient Details Name: [name] MRN: [id number] DOB: 20-Mar-1915 Today's Date: [name]   History of Present Illness  82 year old female with past medical history significant for hypertension, dementia who presented to [facility] long hospital status post mechanical fall after trying to get from wheelchair to walker. was transferred to [facility] and is now s/p ORIF for R intertrochanteric hip fx.  Clinical Impression   Patient is s/p above surgery resulting in functional limitations due to the deficits listed below (see PT Problem List).  Patient will benefit from skilled PT to increase their independence and safety with mobility to allow discharge to the venue listed below.       Follow Up Recommendations SNF    Equipment Recommendations  Other (comment) (to be determined at next venue of care)    Recommendations for Other Services       Precautions / Restrictions Precautions Precautions: Fall Restrictions Weight Bearing Restrictions: Yes RLE Weight Bearing: Touchdown weight bearing RLE Partial Weight Bearing Percentage or Pounds: 30      Mobility  Bed Mobility Overal bed mobility: Needs Assistance;+2 for physical assistance Bed Mobility: Supine to Sit     Supine to sit: +2 for physical assistance;Max assist     General bed mobility comments: Max +2 assist to scoot hips to EOB with bed pad and for truncal support upon sitting. Mod verbal cues for hand placement and technique.  Transfers Overall transfer level: Needs assistance Equipment used: Rolling walker (2 wheeled);2 person hand held assist Transfers: Sit to/from [employer] Sit to Stand: +2 physical assistance;Max assist Stand pivot transfers: +2 physical assistance;Max assist       General transfer comment: Max +2 assist for boost to stand with assist from bed pad for hip extension and anterior weight shift. L knee blocked for stability and verbal cues for hand  placement as pt was holding onto mattress sheet during transfer and needed cues to place hand on RW and chair. Pt very fearful of falling. Poor adherence to TDWB RLE despite verbal/tactile cues  Ambulation/Gait                Stairs            Wheelchair Mobility    Modified Rankin (Stroke Patients Only)       Balance Overall balance assessment: Needs assistance Sitting-balance support: No upper extremity supported;Feet supported Sitting balance-Leahy Scale: Poor Sitting balance - Comments: Pt very kyphotic but able to maintain balance after sitting EOB ~3 minutes and feet supported on floor Postural control: Posterior lean Standing balance support: Bilateral upper extremity supported;During functional activity Standing balance-Leahy Scale: Zero                               Pertinent Vitals/Pain Pain Assessment: 0-10 Pain Score: 9  Pain Location: R hip with movement Pain Descriptors / Indicators: Grimacing;Guarding;Aching Pain Intervention(s): Limited activity within patient's tolerance;Monitored during session;Repositioned    Home Living Family/patient expects to be discharged to:: Private residence Living Arrangements: Alone Available Help at Discharge: Family;Personal care attendant;Available PRN/intermittently Type of Home: House Home Access: Ramped entrance     Home Layout: Two level;Able to live on main level with bedroom/bathroom Home Equipment: Dan Humphreys - 2 wheels;Bedside commode;Shower seat;Hand held shower head;Wheelchair - manual;Hospital bed;Tub bench Additional Comments: Pt has a PCA that comes 5 days/week for 5 hours and daughter assists on other 2 days. Pt spends nights alone.  Prior Function Level of Independence: Needs assistance   Gait / Transfers Assistance Needed: uses RW and sometimes wheelchair  ADL's / Homemaking Assistance Needed: PCA/daughter assist with getting in/out of tub, bathing, dressing, and IADLs.  Comments:  Walks with RW     Hand Dominance   Dominant Hand: Right    Extremity/Trunk Assessment   Upper Extremity Assessment: Generalized weakness (shoulder flexion/abduction 3-/5)           Lower Extremity Assessment: Defer to PT evaluation RLE Deficits / Details: Grossly decr AROM and strength, limited by pain postop; able to activate quad and hamstrings for quad sets and heel slides    Cervical / Trunk Assessment: Kyphotic  Communication   Communication: HOH  Cognition Arousal/Alertness: Awake/alert Behavior During Therapy: WFL for tasks assessed/performed Overall Cognitive Status: Within Functional Limits for tasks assessed (pt does have dementia at baseline)                      General Comments General comments (skin integrity, edema, etc.): Thn film dressings on skin tears L lower leg    Exercises        Assessment/Plan    PT Assessment Patient needs continued PT services  PT Diagnosis Difficulty walking;Acute pain;Generalized weakness   PT Problem List Decreased strength;Decreased range of motion;Decreased activity tolerance;Decreased balance;Decreased coordination;Decreased mobility;Decreased knowledge of use of DME;Decreased knowledge of precautions;Pain;Decreased skin integrity  PT Treatment Interventions DME instruction;Gait training;Functional mobility training;Therapeutic activities;Therapeutic exercise;Balance training;Patient/family education   PT Goals (Current goals can be found in the Care Plan section) Acute Rehab PT Goals Patient Stated Goal: to be able to use RW again PT Goal Formulation: With patient/family Time For Goal Achievement: 10/11/15    Frequency Min 3X/week   Barriers to discharge        Co-evaluation   Reason for Co-Treatment: For patient/therapist safety   OT goals addressed during session: ADL's and self-care       End of Session Equipment Utilized During Treatment: Gait belt Activity Tolerance: Patient limited by  pain;Other (comment) (Seemed to be limited by fear of falling) Patient left: in chair;with call bell/phone within reach;with chair alarm set;with family/visitor present Nurse Communication: Mobility status         Time: [id number] PT Time Calculation (min) (ACUTE ONLY): 25 min   Charges:   PT Evaluation $PT Eval Moderate Complexity: 1 Procedure     PT G CodesOlen Pel:        [name], [name]  [name], PT  Acute Rehabilitation Services Pager [phone] Office [phone]

## 2015-09-27 NOTE — Clinical Social Work Placement (Addendum)
   CLINICAL SOCIAL WORK PLACEMENT  NOTE  Date:  09/27/2015  Patient Details  Name: Janie MorningCarrie N Vanalstine MRN: [id number] Date of Birth: [birth year]  Clinical Social Work is seeking post-discharge placement for this patient at the Skilled  Nursing Facility level of care (*CSW will initial, date and re-position this form in  chart as items are completed):  Yes   Patient/family provided with [facility] Clinical Social Work Department's list of facilities offering this level of care within the geographic area requested by the patient (or if unable, by the patient's family).  Yes   Patient/family informed of their freedom to choose among providers that offer the needed level of care, that participate in Medicare, Medicaid or managed care program needed by the patient, have an available bed and are willing to accept the patient.  Yes   Patient/family informed of [facility]'s ownership interest in [facility] and [facility], as well as of the fact that they are under no obligation to receive care at these facilities.  PASRR submitted to EDS on 09/27/15     PASRR number received on       Existing PASRR number confirmed on 09/27/15     FL2 transmitted to all facilities in geographic area requested by pt/family on 09/27/15     FL2 transmitted to all facilities within larger geographic area on       Patient informed that his/her managed care company has contracts with or will negotiate with certain facilities, including the following:        Yes   Patient/family informed of bed offers received.  Patient chooses bed at [facility]     Physician recommends and patient chooses bed at      Patient to be transferred to [facility] on  .  Patient to be transferred to facility by  [facility]     Patient family notified on  09-29-15 of transfer.  Name of family member notified:   Lanette Floyce StakesGaines grandaughter, and Mikey Bussinglaine Brown daughter.     PHYSICIAN Please  sign FL2, Please sign DNR     Additional Comment:    _______________________________________________ Darleene CleaverAnterhaus, Mayelin Panos R, LCSWA 09/27/2015, 6:22 PM

## 2015-09-27 NOTE — NC FL2 (Signed)
[place] MEDICAID FL2 LEVEL OF CARE SCREENING TOOL     IDENTIFICATION  Patient Name: [name] Birthdate: 1915/01/14 Sex: female Admission Date (Current Location): 09/24/2015  [facility] [place]  [employer]   Facility and Address:  [facility], [address], [place], [place] [address]      Provider Number: [id number]  Attending Physician Name and Address:  Meredeth IdeGagan S Lama, MD  Relative Name and Phone Number:  Mikey BussingBROWN, ELAINE [phone]    Current Level of Care: Hospital Recommended Level of Care: Skilled Nursing Facility Prior Approval Number:    Date Approved/Denied:   PASRR Number: [id number]  Discharge Plan: SNF    Current Diagnoses: Patient Active Problem List   Diagnosis Date Noted  . Hypotension 09/26/2015  . Acute delirium 09/26/2015  . Postoperative anemia due to acute blood loss 09/26/2015  . Noninfected skin tear of left leg 09/26/2015  . Fracture, intertrochanteric, right femur (HCC) 09/25/2015  . Intertrochanteric fracture of right hip (HCC) 09/24/2015  . Fall 09/24/2015  . Benign essential HTN 09/24/2015    Orientation RESPIRATION BLADDER Height & Weight     Self  Normal Continent Weight: 120 lb 1.6 oz (54.477 kg) Height:     BEHAVIORAL SYMPTOMS/MOOD NEUROLOGICAL BOWEL NUTRITION STATUS      Continent Diet (Regular)  AMBULATORY STATUS COMMUNICATION OF NEEDS Skin   Limited Assist Verbally Surgical wounds                       Personal Care Assistance Level of Assistance  Bathing, Dressing Bathing Assistance: Limited assistance   Dressing Assistance: Limited assistance     Functional Limitations Info            SPECIAL CARE FACTORS FREQUENCY  PT (By licensed PT)  OT (By licensed OT)   PT Frequency: 3x a week   OT Frequency 2x a week               Contractures      Additional Factors Info  Allergies, Code Status    Patient is hard of hearing Code Status Info: DNR Allergies Info:  PHENERGAN           Current Medications (09/27/2015):  This is the current hospital active medication list Current Facility-Administered Medications  Medication Dose Route Frequency Provider Last Rate Last Dose  . 0.9 %  sodium chloride infusion   Intravenous Continuous Meredeth IdeGagan S Lama, MD 10 mL/hr at 09/27/15 1002    . acetaminophen (TYLENOL) tablet 650 mg  650 mg Oral Q6H PRN Samson FredericBrian Swinteck, MD   650 mg at 09/27/15 1338   Or  . acetaminophen (TYLENOL) suppository 650 mg  650 mg Rectal Q6H PRN Samson FredericBrian Swinteck, MD      . albuterol (PROVENTIL) (2.5 MG/3ML) 0.083% nebulizer solution 2.5 mg  2.5 mg Nebulization Q6H PRN Ancil Boozeraylor P Stone, RPH      . aspirin EC tablet 81 mg  81 mg Oral BID PC Samson FredericBrian Swinteck, MD   81 mg at 09/27/15 0909  . famotidine (PEPCID) tablet 20 mg  20 mg Oral Daily Alison MurrayAlma M Devine, MD   20 mg at 09/27/15 0909  . feeding supplement (GLUCERNA SHAKE) (GLUCERNA SHAKE) liquid 237 mL  237 mL Oral BID BM Christiane Haorinna L Sullivan, MD   237 mL at 09/27/15 1337  . furosemide (LASIX) tablet 20 mg  20 mg Oral Daily Meredeth IdeGagan S Lama, MD   20 mg at 09/27/15 1101  .  HYDROcodone-acetaminophen (NORCO/VICODIN) 5-325 MG per tablet 1-2 tablet  1-2 tablet Oral Q6H PRN Samson Frederic, MD   1 tablet at 09/25/15 2322  . menthol-cetylpyridinium (CEPACOL) lozenge 3 mg  1 lozenge Oral PRN Samson Frederic, MD       Or  . phenol (CHLORASEPTIC) mouth spray 1 spray  1 spray Mouth/Throat PRN Samson Frederic, MD      . metoCLOPramide (REGLAN) tablet 5-10 mg  5-10 mg Oral Q8H PRN Samson Frederic, MD       Or  . metoCLOPramide (REGLAN) injection 5-10 mg  5-10 mg Intravenous Q8H PRN Samson Frederic, MD      . morphine 2 MG/ML injection 0.5 mg  0.5 mg Intravenous Q2H PRN Samson Frederic, MD      . ondansetron [facility]) tablet 4 mg  4 mg Oral Q6H PRN Samson Frederic, MD       Or  . ondansetron (ZOFRAN) injection 4 mg  4 mg Intravenous Q6H PRN Samson Frederic, MD      . pantoprazole (PROTONIX) EC tablet 40 mg  40 mg Oral Daily Alison Murray, MD   40 mg at 09/27/15 1610  . saccharomyces boulardii (FLORASTOR) capsule 250 mg  250 mg Oral Daily Alison Murray, MD   250 mg at 09/27/15 9604     Discharge Medications: Please see discharge summary for a list of discharge medications.  Relevant Imaging Results:  Relevant Lab Results:   Additional Information SSN [id number]  Darleene Cleaver, [place]

## 2015-09-27 NOTE — Clinical Documentation Improvement (Signed)
Internal Medicine  Can the diagnosis of CHF be further specified?    Acuity - Acute, Chronic, Acute on Chronic   Type - Systolic, Diastolic, Systolic and Diastolic  Other  Clinically Undetermined   Document any associated diagnoses/conditions  Supporting Information: 09/27/15  History of congestive heart failure. Echocardiogram showed grade 1 diastolic dysfunction. As patient's blood pressure has improved. Will cut down the fluids to [facility] and start patient's home dose of Lasix 20 mg by mouth daily. ECHO COMPLETE WO IMAGE ENHANCING 03/ Left ventricle: The cavity size was normal. There was moderate focal basal hypertrophy of the septum. Systolic function was vigorous. The estimated ejection fraction was in the range of 65% to 70%. Wall motion was normal; there were no regional wall motion abnormalities. Doppler parameters are consistent with abnormal left ventricular relaxation (grade 1 diastolic dysfunction). Doppler parameters are consistent with elevated ventricular end-diastolic filling pressure.  Please exercise your independent, professional judgment when responding. A specific answer is not anticipated or expected. Please update your documentation within the medical record to reflect your response to this query. Thank you  Thank Barrie DunkerYou,  Yuuki Skeens C Ciel Yanes [employer] [place] [phone]

## 2015-09-27 NOTE — Clinical Social Work Note (Signed)
Clinical Social Work Assessment  Patient Details  Name: [name] MRN: [id number] Date of Birth: [birth year]  Date of referral:  09/27/15               Reason for consult:  Facility Placement                Permission sought to share information with:  Family Supports, Magazine features editoracility Contact Representative Permission granted to share information::  Yes, Verbal Permission Granted  Name::     [name],[name] [phone]  Agency::  SNF admissions  Relationship::     Contact Information:     Housing/Transportation Living arrangements for the past 2 months:  Skilled Nursing Facility Source of Information:  Adult Children, Other (Comment Required) Engineer, building services([name]) Patient Interpreter Needed:  None Criminal Activity/Legal Involvement Pertinent to Current Situation/Hospitalization:  No - Comment as needed Significant Relationships:  Adult Children, Other Family Members Lives with:  Self Do you feel safe going back to the place where you live?  No (Patient needs some short term rehab before she can return back home.) Need for family participation in patient care:  Yes (Comment) (Patient requests that family help with decision making for SNF.)  Care giving concerns: Patient and family would [facility] before returning back home.    Social Worker assessment / plan:  Patient is a 82 year old female who lives alone patient is alert and oriented x2, patient's daughter was at bedside.  Patient's [name] was notified via phone.  Patient was sleeping and did not want to wake up for assessment, so assessment was completed by talking with patient's daughter and granddaughter.  Patient lives alone and has caregivers come to her home for part of the day, patient's family is quite involved in her care and also help with patient's day to day activities.  Patient's family expressed that she is very healthy overall, and has not had any major medical issues.   Patient's family expressed that she has not been to SNF for short term rehab before.  CSW explained to patient and her family about what to expect with SNF placement, and what the role is of CSW.  Patient's family were explained what to expect with day of discharge and the process of discharging.  Patient's family stated they would like her to go to [facility] or [facility].  Patient's family expressed they did not have any other questions or concerns, CSW gave the family contact information if they think of any other questions.  Employment status:  Retired Database administratornsurance information:  Managed Medicare PT Recommendations:  Skilled Nursing Facility Information / Referral to community resources:  Skilled Nursing Facility  Patient/Family's Response to care:  Patient and family agreeable to going to SNF for short term rehab.  Patient/Family's Understanding of and Emotional Response to Diagnosis, Current Treatment, and Prognosis:  Patient's family aware of current diagnosis and treatment plan.  Emotional Assessment Appearance:  Appears stated age Attitude/Demeanor/Rapport:    Affect (typically observed):  Appropriate, Calm, Stable, Pleasant Orientation:  Oriented to Self, Oriented to Situation Alcohol / Substance use:  Not Applicable Psych involvement (Current and /or in the community):  No (Comment)  Discharge Needs  Concerns to be addressed:  Lack of Support Readmission within the last 30 days:  No Current discharge risk:  Lack of support system, Lives alone Barriers to Discharge:  No Barriers Identified   [place] [employer], Jacqualin Shirkey R, LCSWA 09/27/2015, 6:13 PM

## 2015-09-27 NOTE — Progress Notes (Signed)
   Subjective:  Patient reports pain as mild to moderate.  Delirium o/n. Refused am labs.   Objective:   VITALS:   Filed Vitals:   09/26/15 1007 09/26/15 1300 09/26/15 2300 09/27/15 0655  BP: 83/41 94/43 93/45  136/50  Pulse: 84 86 83   Temp:  97.6 F (36.4 C) 98.6 F (37 C)   TempSrc:  Oral Oral   Resp:  16 16   Weight:      SpO2:  100% 94%     ABD soft Intact pulses distally Dorsiflexion/Plantar flexion intact Compartment soft Ss with active drainage from distal incision - dressing saturated. Distal incision cleaned with chlorhexidine and additional staples added. New dressing applied.  Lab Results  Component Value Date   WBC 10.4 09/26/2015   HGB 8.4* 09/26/2015   HCT 26.8* 09/26/2015   MCV 99.0 09/26/2015   PLT 170 09/26/2015   BMET    Component Value Date/Time   NA 138 09/26/2015 0511   K 3.6 09/26/2015 0511   CL 100* 09/26/2015 0511   CO2 31 09/26/2015 0511   GLUCOSE 131* 09/26/2015 0511   BUN 13 09/26/2015 0511   CREATININE 0.82 09/26/2015 0511   CALCIUM 8.6* 09/26/2015 0511   GFRNONAA 57* 09/26/2015 0511   GFRAA >60 09/26/2015 0511     Assessment/Plan: 2 Days Post-Op   Principal Problem:   Intertrochanteric fracture of right hip (HCC) Active Problems:   Fall   Benign essential HTN   Fracture, intertrochanteric, right femur (HCC)   Hypotension   Acute delirium   Postoperative anemia due to acute blood loss   Noninfected skin tear of left leg   TDWB RLE with walker DVT ppx: ASA 81 mg PO BID due to bleeding PT/OT for bed to chair xfers Hypotension: BP improved ABLA: am labs pending, may need PRBCs  Dispo: D/C planning   Ivylynn Hoppes, Cloyde ReamsBrian James 09/27/2015, 7:24 AM   Samson FredericBrian Estefana Taylor, MD Cell [phone]

## 2015-09-27 NOTE — Progress Notes (Signed)
Patient refused lab work this morning. Caregiver at bedside convey patient's daughter will be here at 0800. Will re-attempt at that time to draw labs.

## 2015-09-28 LAB — CBC
HCT: 22.9 % — ABNORMAL LOW (ref 36.0–46.0)
Hemoglobin: 7.2 g/dL — ABNORMAL LOW (ref 12.0–15.0)
MCH: 30.8 pg (ref 26.0–34.0)
MCHC: 31.4 g/dL (ref 30.0–36.0)
MCV: 97.9 fL (ref 78.0–100.0)
PLATELETS: 170 10*3/uL (ref 150–400)
RBC: 2.34 MIL/uL — AB (ref 3.87–5.11)
RDW: 13.4 % (ref 11.5–15.5)
WBC: 7.8 10*3/uL (ref 4.0–10.5)

## 2015-09-28 LAB — PREPARE RBC (CROSSMATCH)

## 2015-09-28 LAB — ABO/RH: ABO/RH(D): O POS

## 2015-09-28 MED ORDER — SODIUM CHLORIDE 0.9 % IV SOLN: Freq: Once | INTRAVENOUS | Status: DC

## 2015-09-28 MED ORDER — FUROSEMIDE 10 MG/ML IJ SOLN
20.0000 mg | Freq: Once | INTRAMUSCULAR | Status: AC
  Administered 2015-09-28: 20 mg via INTRAVENOUS
  Filled 2015-09-28: qty 2

## 2015-09-28 MED ORDER — METOPROLOL TARTRATE 12.5 MG HALF TABLET
12.5000 mg | ORAL_TABLET | Freq: Two times a day (BID) | ORAL | Status: DC
  Administered 2015-09-29 (×2): 12.5 mg via ORAL
  Filled 2015-09-28 (×2): qty 1

## 2015-09-28 NOTE — Progress Notes (Signed)
   Subjective:  Patient reports pain as mild to moderate.   Objective:   VITALS:   Filed Vitals:   09/27/15 1300 09/27/15 1900 09/28/15 0500 09/28/15 0551  BP: 105/52 91/48 116/30 140/53  Pulse: 98 104 65   Temp: 97.8 F (36.6 C) 98.3 F (36.8 C) 98.7 F (37.1 C)   TempSrc:  Oral Oral   Resp: 18 18 18    Weight:   55.702 kg (122 lb 12.8 oz)   SpO2: 100% 100% 90%     ABD soft Intact pulses distally Dorsiflexion/Plantar flexion intact Compartment soft Dressings c/d/i No hematoma  Lab Results  Component Value Date   WBC 7.8 09/28/2015   HGB 7.2* 09/28/2015   HCT 22.9* 09/28/2015   MCV 97.9 09/28/2015   PLT 170 09/28/2015   BMET    Component Value Date/Time   NA 138 09/27/2015 1046   K 3.8 09/27/2015 1046   CL 104 09/27/2015 1046   CO2 27 09/27/2015 1046   GLUCOSE 132* 09/27/2015 1046   BUN 13 09/27/2015 1046   CREATININE 0.79 09/27/2015 1046   CALCIUM 8.4* 09/27/2015 1046   GFRNONAA >60 09/27/2015 1046   GFRAA >60 09/27/2015 1046     Assessment/Plan: 3 Days Post-Op   Principal Problem:   Intertrochanteric fracture of right hip (HCC) Active Problems:   Fall   Benign essential HTN   Fracture, intertrochanteric, right femur (HCC)   Hypotension   Acute delirium   Postoperative anemia due to acute blood loss   Noninfected skin tear of left leg   TDWB RLE with walker DVT ppx: ASA 81 mg PO BID due to bleeding PT/OT for bed to chair xfers ABLA: hgb currently 7.2, symptomatic with hypotension and tachycardia - transfuse 2 units PRBCs Dispo: D/C planning   Caci Orren, Cloyde ReamsBrian James 09/28/2015, 7:41 AM   Samson FredericBrian Carly Applegate, MD Cell [phone]

## 2015-09-28 NOTE — Progress Notes (Addendum)
[facility] PROGRESS NOTE  SEIDY LABRECK ZOX:[id number] DOB: 08/09/1914 DOA: 09/24/2015 PCP: Philemon Kingdom, MD  Assessment/Plan: 1. Intertrochanteric fracture of right hip- status post intramedullary nail. Orthopedics following 2. History of congestive heart failure. Echocardiogram showed grade 1 diastolic dysfunction. As patient's blood pressure has improved. Will cut down the fluids to [facility] and start patient's home dose of Lasix 20 mg by mouth daily. 3. History of hypertension- patient takes lisinopril, metoprolol at home which are on hold due to hypotension. Consider restarting these medications if blood pressure starts to go up. 4. Anemia- today hemoglobin is 7.2, 2 units PRBC has been ordered. One dose of Lasix 20 g IV 1 ordered between 2 units. Will follow CBC in a.m. 5. DVT prophylaxis- patient started on aspirin 81 mg twice a day as per orthopedics.    Code Status: DO NOT RESUSCITATE Family Communication: *Discussed with patient's daughter at bedside Disposition Plan: Skilled nursing facility when medically stable   Consultants:  Orthopedic surgery  Procedures:  Intramedullary fixation of right intertrochanteric femur fracture  Antibiotics:  None  HPI/Subjective: 82 year old female with past medical history significant for hypertension, dementia who presented to [facility] long hospital status post mechanical fall after trying to get from wheelchair to walker. Patient is not a very good historian because of her dementia. Her daughter at the bedside provided most of the history. There was no report of lightheadedness or loss of consciousness before or after the fall.  In ED, patient was hemodynamically stable. Her blood work was relatively unremarkable. Plain films demonstrated right intertrochanteric hip fracture.  Patient underwent intramedullary fixation of right intertrochanteric femur fracture.  This morning her hemoglobin is 7.2, two units PRBC  ordered.   Objective: Filed Vitals:   09/28/15 1110 09/28/15 1146  BP: 121/74 124/55  Pulse: 77 72  Temp: 98.8 F (37.1 C) 98.6 F (37 C)  Resp: 16 18    Intake/Output Summary (Last 24 hours) at 09/28/15 1333 Last data filed at 09/28/15 1146  Gross per 24 hour  Intake     30 ml  Output      0 ml  Net     30 ml   Filed Weights   09/26/15 0608 09/28/15 0500  Weight: 54.477 kg (120 lb 1.6 oz) 55.702 kg (122 lb 12.8 oz)    Exam:   General:  Appears in mild respiratory distress  Cardiovascular: S1-S2 normal, regular rhythm  Respiratory: Clear to auscultation bilaterally  Abdomen: Soft, nontender, no organomegaly  Musculoskeletal: No cyanosis/clubbing/edema of the lower extremities   Data Reviewed: Basic Metabolic Panel:  Recent Labs Lab 09/24/15 1230 09/25/15 2014 09/26/15 0511 09/27/15 1046  NA 144  --  138 138  K 4.2  --  3.6 3.8  CL 102  --  100* 104  CO2 31  --  31 27  GLUCOSE 137*  --  131* 132*  BUN 29*  --  13 13  CREATININE 0.99 0.90 0.82 0.79  CALCIUM 9.7  --  8.6* 8.4*   Liver Function Tests: No results for input(s): AST, ALT, ALKPHOS, BILITOT, PROT, ALBUMIN in the last 168 hours. No results for input(s): LIPASE, AMYLASE in the last 168 hours. No results for input(s): AMMONIA in the last 168 hours. CBC:  Recent Labs Lab 09/24/15 1230 09/25/15 2014 09/26/15 0511 09/26/15 1316 09/27/15 1046 09/28/15 0510  WBC 8.1 11.5* 10.4  --  7.7 7.8  NEUTROABS 7.2  --   --   --   --   --  HGB 12.5 10.1* 8.8* 8.4* 7.8* 7.2*  HCT 39.4 31.1* 28.4* 26.8* 23.7* 22.9*  MCV 101.0* 100.0 99.0  --  97.9 97.9  PLT 271 166 170  --  138* 170    CBG:  Recent Labs Lab 09/24/15 1647 09/24/15 2103 09/25/15 0706 09/25/15 1155  GLUCAP 117* 109* 111* 102*    No results found for this or any previous visit (from the past 240 hour(s)).   Studies: No results found.  Scheduled Meds: . sodium chloride   Intravenous Once  . aspirin EC  81 mg Oral BID PC   . famotidine  20 mg Oral Daily  . feeding supplement (GLUCERNA SHAKE)  237 mL Oral BID BM  . furosemide  20 mg Intravenous Once  . pantoprazole  40 mg Oral Daily  . saccharomyces boulardii  250 mg Oral Daily   Continuous Infusions: . sodium chloride 10 mL/hr at 09/27/15 1002    Principal Problem:   Intertrochanteric fracture of right hip [facility](HCC) Active Problems:   Fall   Benign essential HTN   Fracture, intertrochanteric, right femur (HCC)   Hypotension   Acute delirium   Postoperative anemia due to acute blood loss   Noninfected skin tear of left leg    Time spent: 25 min    [facility] Campus[facility],Arland Usery S  [facility] Pager [phone]. If 7PM-7AM, please contact night-coverage at [email or web address], password [email or web address] 09/28/2015, 1:33 PM  LOS: 4 days

## 2015-09-29 LAB — COMPREHENSIVE METABOLIC PANEL
ALT: 9 U/L — ABNORMAL LOW (ref 14–54)
AST: 27 U/L (ref 15–41)
Albumin: 2.6 g/dL — ABNORMAL LOW (ref 3.5–5.0)
Alkaline Phosphatase: 48 U/L (ref 38–126)
Anion gap: 7 (ref 5–15)
BUN: 16 mg/dL (ref 6–20)
CHLORIDE: 101 mmol/L (ref 101–111)
CO2: 30 mmol/L (ref 22–32)
CREATININE: 0.82 mg/dL (ref 0.44–1.00)
Calcium: 8.8 mg/dL — ABNORMAL LOW (ref 8.9–10.3)
GFR, EST NON AFRICAN AMERICAN: 57 mL/min — AB (ref >60)
Glucose, Bld: 107 mg/dL — ABNORMAL HIGH (ref 65–99)
POTASSIUM: 4 mmol/L (ref 3.5–5.1)
Sodium: 138 mmol/L (ref 135–145)
TOTAL PROTEIN: 5.1 g/dL — AB (ref 6.5–8.1)
Total Bilirubin: 1 mg/dL (ref 0.3–1.2)

## 2015-09-29 LAB — CBC
HEMATOCRIT: 33.9 % — AB (ref 36.0–46.0)
Hemoglobin: 11 g/dL — ABNORMAL LOW (ref 12.0–15.0)
MCH: 30.1 pg (ref 26.0–34.0)
MCHC: 32.4 g/dL (ref 30.0–36.0)
MCV: 92.9 fL (ref 78.0–100.0)
Platelets: 172 10*3/uL (ref 150–400)
RBC: 3.65 MIL/uL — AB (ref 3.87–5.11)
RDW: 16.1 % — AB (ref 11.5–15.5)
WBC: 8 10*3/uL (ref 4.0–10.5)

## 2015-09-29 MED ORDER — ASPIRIN 81 MG PO TBEC: 81.0000 mg | DELAYED_RELEASE_TABLET | Freq: Two times a day (BID) | ORAL | Status: AC

## 2015-09-29 MED ORDER — HYDROCODONE-ACETAMINOPHEN 5-325 MG PO TABS: 1.0000 | ORAL_TABLET | Freq: Four times a day (QID) | ORAL | Status: AC | PRN

## 2015-09-29 MED ORDER — FUROSEMIDE 20 MG PO TABS
20.0000 mg | ORAL_TABLET | Freq: Every day | ORAL | Status: DC
  Administered 2015-09-29: 20 mg via ORAL
  Filled 2015-09-29: qty 1

## 2015-09-29 NOTE — Progress Notes (Signed)
Physical Therapy Treatment Patient Details Name: [name] MRN: [id number] DOB: 07/29/1914 Today's Date: 09/29/2015    History of Present Illness 82 year old female with past medical history significant for hypertension, dementia who [facility] status post mechanical fall after trying to get from wheelchair to walker. [facility] and is now s/p ORIF for R intertrochanteric hip fx.    PT Comments    Pt with significant more difficulty trying to stand this PM.  Pt only was able to 1/2 stand once and attempted several times with RW, steady, and finally used the maxi move lift to get back into bed. Pt sustained a new left shin skin tear (RN made aware-wound care RN consulted) while attempting to use the steady standing frame to assist with safe transfer of the patient [facility].  Due to pt fatigue and inability to use steady and newly discovered wound, we switched to the maxi move total lift to get her back in bed. Next session if we get her to recliner chair in AM, we may need to plan on using the maxi move in PM to get her back as she fatigues quickly.   Follow Up Recommendations  SNF     Equipment Recommendations  None recommended by PT    Recommendations for Other Services    NA    Precautions / Restrictions Precautions Precautions: Fall Precaution Comments: fear of falling, significant posterior lean Restrictions RLE Weight Bearing: Touchdown weight bearing RLE Partial Weight Bearing Percentage or Pounds: 30    Mobility  Bed Mobility Overal bed mobility: Needs Assistance Bed Mobility: Rolling Rolling: Total assist   Supine to sit: Mod assist;HOB elevated     General bed mobility comments: Total assist to roll bil to get off of lift pad and position pt in sidelying.  Pt resisting rolling in opposite direction of roll with her trunk and hands fearful we are going to roll her off of the bed.   Transfers Overall  transfer level: Needs assistance Equipment used: Rolling walker (2 wheeled) Transfers: Sit to/from [employer]Stand;Stand Pivot Transfers Sit to Stand: +2 physical assistance;Max assist Stand pivot transfers: +2 physical assistance;Max assist;From elevated surface       General transfer comment: Stood x1 successfully from recliner to steady standing frame, moved recliner and positioned BSC behind pt.  Unable, with multiple attempts to get to standing again with Steady.  Pt continues to have significant posterior lean and inability even with feet blocked to get feet up under her (knees extended, hips extended and toes plantarflexed).  Pt pushing posteriorly with both arms as well.  After several attempts to stand with the steady, I realized that the shin pad on the steady was causing a new skin tear on her left shin.  RN made aware area cleaned and dressed.  Wound care RN.  Ended up using the maxi move total lift to get pt back into the bed.   Ambulation/Gait             General Gait Details: Unable at this time.           Balance Overall balance assessment: Needs assistance Sitting-balance support: Feet supported;Bilateral upper extremity supported Sitting balance-Leahy Scale: Poor Sitting balance - Comments: mod to max assist to lean forward to scoot to edge of recliner chair. Postural control: Posterior lean Standing balance support: Bilateral upper extremity supported Standing balance-Leahy Scale: Zero Standing balance comment: Significantly greater posterior lean in standing.  Cognition Arousal/Alertness: Lethargic (likely due to fatiuge) Behavior During Therapy: WFL for tasks assessed/performed Overall Cognitive Status: History of cognitive impairments - at baseline (a bit exacerbated here in the hosptial)                      Exercises Total Joint Exercises Ankle Circles/Pumps: AAROM;Both;20 reps Quad Sets: AROM;Both;10 reps Short Arc QuadBarbaraann [name];Both;10 reps Heel Slides: AAROM;Both;10 reps Hip ABduction/ADduction: AAROM;Both;10 reps Long Arc Quad: AROM;Both;10 reps        Pertinent Vitals/Pain Pain Assessment: Faces Faces Pain Scale: Hurts whole lot Pain Location: right leg and left shin Pain Descriptors / Indicators: Grimacing;Guarding Pain Intervention(s): Limited activity within patient's tolerance;Monitored during session;Repositioned           PT Goals (current goals can now be found in the care plan section) Acute Rehab PT Goals Patient Stated Goal: to be able to use RW again Progress towards PT goals: Not progressing toward goals - comment (too fatigued this PM)    Frequency  Min 3X/week    PT Plan Current plan remains appropriate       End of Session Equipment Utilized During Treatment: Gait belt Activity Tolerance: Patient limited by fatigue;Patient limited by pain Patient left: in bed;with call bell/phone within reach;with family/visitor present;with nursing/sitter in room     Time: 1610-9604 PT Time Calculation (min) (ACUTE ONLY): 66 min  Charges:  $Therapeutic Exercise: 8-22 mins $Therapeutic Activity: 38-52 mins                      [name] B. Tristan Bramble, PT, DPT [phone]   09/29/2015, 2:43 PM

## 2015-09-29 NOTE — Clinical Social Work Note (Signed)
Patient to be d/c'ed today to [facility].  Patient and family agreeable to plans will transport via ems RN to call report to [phone] room 109.  Windell MouldingEric Cylinda Santoli, MSW, Theresia MajorsLCSWA [phone]

## 2015-09-29 NOTE — Discharge Summary (Addendum)
Physician Discharge Summary  IMMACULATE CRUTCHER ZHY:[id number] DOB: 1914/07/10 DOA: 09/24/2015  PCP: Philemon Kingdom, MD  Admit date: 09/24/2015 Discharge date: 09/29/2015  Time spent: 25 * minutes  Recommendations for Outpatient Follow-up:  1. Follow up Orthopedics in 2 weeks   Discharge Diagnoses:  Principal Problem:   Intertrochanteric fracture of right hip [facility]) Active Problems:   Fall   Benign essential HTN   Fracture, intertrochanteric, right femur (HCC)   Hypotension   Acute delirium   Postoperative anemia due to acute blood loss   Noninfected skin tear of left leg   Discharge Condition: Stable  Diet recommendation: Low salt diet  Filed Weights   09/26/15 0608 09/28/15 0500 09/29/15 0700  Weight: 54.477 kg (120 lb 1.6 oz) 55.702 kg (122 lb 12.8 oz) 52.617 kg (116 lb)    History of present illness:  82 year old female with past medical history significant for hypertension, dementia who presented to [facility] long hospital status post mechanical fall after trying to get from wheelchair to walker. Patient is not a very good historian because of her dementia. Her daughter at the bedside provided most of the history. There was no report of lightheadedness or loss of consciousness before or after the fall. In ED, patient was hemodynamically stable. Her blood work was relatively unremarkable. Plain films demonstrated right intertrochanteric hip fracture.   Hospital Course:  1. Intertrochanteric fracture of right hip- status post intramedullary nail. Orthopedics Will follow the patient in 2 weeks. 2. History of congestive heart failure. Echocardiogram showed grade 1 diastolic dysfunction. As patient's blood pressure has improved. Will cut down the fluids to [facility] and start patient's home dose of Lasix 20 mg by mouth daily. 3. History of hypertension- patient takes lisinopril, metoprolol at home which are on hold due to hypotension. Blood pressure is now going up, will restart home  medications. 4. Anemia-Postoperative blood loss anemia, today hemoglobin is 7.2, patient is status post 2 units PRBC. Today hemoglobin is 11.0.  5. DVT prophylaxis - Patient started on aspirin 81 mg twice a day as per orthopedics for total 6 weeks  Procedures: Intramedullary nail for intertrochanteric fracture right hip  Consultations:  Orthopedics  Discharge Exam: Filed Vitals:   09/29/15 0700 09/29/15 1441  BP: 162/88 126/96  Pulse: 83 68  Temp: 94.3 F (34.6 C) 98.6 F (37 C)  Resp: 18 20    General: Appears in no acute distress Cardiovascular: S1-S2 regular Respiratory: Clear bilaterally  Discharge Instructions   Discharge Instructions    Diet - low sodium heart healthy    Complete by:  As directed      Increase activity slowly    Complete by:  As directed           Current Discharge Medication List    START taking these medications   Details  aspirin EC 81 MG EC tablet Take 1 tablet (81 mg total) by mouth 2 (two) times daily after a meal.    HYDROcodone-acetaminophen (NORCO/VICODIN) 5-325 MG tablet Take 1-2 tablets by mouth every 6 (six) hours as needed for moderate pain. Qty: 30 tablet, Refills: 0      CONTINUE these medications which have NOT CHANGED   Details  acetaminophen (TYLENOL) 650 MG CR tablet Take 650 mg by mouth every 8 (eight) hours as needed for pain.    albuterol (PROVENTIL HFA;VENTOLIN HFA) 108 (90 Base) MCG/ACT inhaler Inhale 2 puffs into the lungs every 6 (six) hours as needed for wheezing or shortness of breath.  furosemide (LASIX) 20 MG tablet Take 20 mg by mouth daily.    lisinopril (PRINIVIL,ZESTRIL) 5 MG tablet Take 5 mg by mouth 2 (two) times daily. Take 1 tablet (5 mg) in the am and Take 0.5 tablet (2.5 mg) in the pm    metoprolol tartrate (LOPRESSOR) 25 MG tablet Take 12.5 mg by mouth 2 (two) times daily.    omeprazole (PRILOSEC) 40 MG capsule Take 40 mg by mouth daily.    Probiotic Product (PROBIOTIC PO) Take 1 capsule by  mouth daily.    ranitidine (ZANTAC) 300 MG tablet Take 300 mg by mouth at bedtime.       Allergies  Allergen Reactions  . Phenergan [Promethazine Hcl]    Follow-up Information    Follow up with Swinteck, Cloyde Reams, MD In 2 weeks.   Specialty:  Orthopedic Surgery   Why:  For wound re-check, For suture removal   Contact information:   [address] [place] [address] [phone]        The results of significant diagnostics from this hospitalization (including imaging, microbiology, ancillary and laboratory) are listed below for reference.    Significant Diagnostic Studies: Dg Chest 1 View  09/25/2015  CLINICAL DATA:  Fall, right hip pain, preop chest EXAM: CHEST 1 VIEW COMPARISON:  03/09/2013 FINDINGS: Lungs are clear.  No pleural effusion or pneumothorax. Stable chronic blunting of the left costophrenic angle. The heart is normal in size. Degenerative changes of the right shoulder. IMPRESSION: No evidence of acute cardiopulmonary disease. Electronically Signed   By: Charline Bills M.D.   On: 09/25/2015 13:08   Pelvis Portable  09/25/2015  CLINICAL DATA:  Status post ORIF of proximal right femoral fracture EXAM: PORTABLE PELVIS 1-2 VIEWS COMPARISON:  09/24/2015 FINDINGS: Left hip replacement is noted. Postsurgical changes in the proximal right femur are seen. Pelvic ring appears intact. Generalized osteopenia is noted. No soft tissue abnormality is seen. IMPRESSION: Status post ORIF of right femoral fracture. Electronically Signed   By: Alcide Clever M.D.   On: 09/25/2015 20:51   Dg C-arm 1-60 Min  09/25/2015  CLINICAL DATA:  ORIF right femur fracture EXAM: DG C-ARM 61-120 MIN; RIGHT FEMUR 2 VIEWS COMPARISON:  09/24/2015 right femur radiographs. FINDINGS: Fluoroscopy time 1 minutes 14 seconds. Spot fluoroscopic nondiagnostic intraoperative right femur radiographs demonstrate transfixation of the intratrochanteric right proximal femur fracture with intra  medullary rod and interlocking right femoral neck pain and distal interlocking screw. IMPRESSION: Intraoperative fluoroscopic guidance for ORIF right femur fracture. Electronically Signed   By: Delbert Phenix M.D.   On: 09/25/2015 17:10   Dg Hip Unilat  With Pelvis 2-3 Views Right  09/24/2015  CLINICAL DATA:  Follow onto right hip.  Right hip pain. EXAM: DG HIP (WITH OR WITHOUT PELVIS) 2-3V RIGHT COMPARISON:  None. FINDINGS: Transverse right hip intertrochanteric fracture extends transversely at the level of the lesser trochanter, mild varus angulation, separate lesser trochanter fracture. Because this is not a pure shaft fracture I classify it as intertrochanteric rather than sub trochanteric. Severe axial loss of articular space in the right hip. Associated degenerative spurring. Prior left hip prosthesis. Deformity of the left inferior pubic ramus likely from old fracture. Central calcification in the pelvis likely from calcified fibroid. Other vascular calcifications are present. Bony demineralization. IMPRESSION: 1. Acute low right transverse intertrochanteric fracture with separate lesser trochanter fragment. 2. Degenerative arthropathy of the right hip. 3. Left hip prosthesis. 4. Healed deformity in the left pubic rami. Electronically Signed  By: Gaylyn RongWalter  Liebkemann M.D.   On: 09/24/2015 13:58   Dg Femur, Min 2 Views Right  09/25/2015  CLINICAL DATA:  ORIF right femur fracture EXAM: DG C-ARM 61-120 MIN; RIGHT FEMUR 2 VIEWS COMPARISON:  09/24/2015 right femur radiographs. FINDINGS: Fluoroscopy time 1 minutes 14 seconds. Spot fluoroscopic nondiagnostic intraoperative right femur radiographs demonstrate transfixation of the intratrochanteric right proximal femur fracture with intra medullary rod and interlocking right femoral neck pain and distal interlocking screw. IMPRESSION: Intraoperative fluoroscopic guidance for ORIF right femur fracture. Electronically Signed   By: Delbert PhenixJason A Poff M.D.   On: 09/25/2015  17:10   Dg Femur Port, Min 2 Views Right  09/25/2015  CLINICAL DATA:  82 year old female with history of ORIF of the right hip. EXAM: RIGHT FEMUR PORTABLE 1 VIEW COMPARISON:  09/24/2015. FINDINGS: There is a new long stem gamma nail fixation device traversing the previously demonstrated intertrochanteric fracture of the right proximal femur. Restoration of near anatomic alignment has been achieved. There are skin staples lateral to the ileum, proximal femur and distal femur. Gas is present in the soft tissues lateral to the right hip joint. IMPRESSION: 1. Expected postoperative changes of ORIF for right femur intertrochanteric fracture, as above, without acute complicating features. Electronically Signed   By: Trudie Reedaniel  Entrikin M.D.   On: 09/25/2015 17:44   Dg Femur Port, Min 2 Views Right  09/25/2015  CLINICAL DATA:  Preoperative right hip fracture EXAM: RIGHT FEMUR PORTABLE 1 VIEW COMPARISON:  None. FINDINGS: Comminuted inter trochanteric fractures of the proximal right femur with varus angulation of the fracture fragments. There is medial and superior displacement of the distal femoral shaft fragment. Displaced lesser trochanteric fragment. Degenerative changes in the right knee. Vascular calcifications. IMPRESSION: Comminuted inter trochanteric fractures of the proximal right femur with varus angulation. Electronically Signed   By: Burman NievesWilliam  Stevens M.D.   On: 09/25/2015 01:34    Microbiology: No results found for this or any previous visit (from the past 240 hour(s)).   Labs: Basic Metabolic Panel:  Recent Labs Lab 09/24/15 1230 09/25/15 2014 09/26/15 0511 09/27/15 1046 09/29/15 0557  NA 144  --  138 138 138  K 4.2  --  3.6 3.8 4.0  CL 102  --  100* 104 101  CO2 31  --  31 27 30   GLUCOSE 137*  --  131* 132* 107*  BUN 29*  --  13 13 16   CREATININE 0.99 0.90 0.82 0.79 0.82  CALCIUM 9.7  --  8.6* 8.4* 8.8*   Liver Function Tests:  Recent Labs Lab 09/29/15 0557  AST 27  ALT 9*   ALKPHOS 48  BILITOT 1.0  PROT 5.1*  ALBUMIN 2.6*   No results for input(s): LIPASE, AMYLASE in the last 168 hours. No results for input(s): AMMONIA in the last 168 hours. CBC:  Recent Labs Lab 09/24/15 1230 09/25/15 2014 09/26/15 0511 09/26/15 1316 09/27/15 1046 09/28/15 0510 09/29/15 0557  WBC 8.1 11.5* 10.4  --  7.7 7.8 8.0  NEUTROABS 7.2  --   --   --   --   --   --   HGB 12.5 10.1* 8.8* 8.4* 7.8* 7.2* 11.0*  HCT 39.4 31.1* 28.4* 26.8* 23.7* 22.9* 33.9*  MCV 101.0* 100.0 99.0  --  97.9 97.9 92.9  PLT 271 166 170  --  138* 170 172    CBG:  Recent Labs Lab 09/24/15 1647 09/24/15 2103 09/25/15 0706 09/25/15 1155  GLUCAP 117* 109* 111* 102*  SignedMeredeth Ide MD.  [facility] 09/29/2015, 3:00 PM

## 2015-09-29 NOTE — Progress Notes (Signed)
Patient family at bedside concerned why patient hasn't been started on blood pressure medication that she was taking at home.Text paged Maren ReamerKaren Kirby NP.

## 2015-09-29 NOTE — Progress Notes (Signed)
   Subjective:  No c/o.  Objective:   VITALS:   Filed Vitals:   09/28/15 2225 09/29/15 0123 09/29/15 0700 09/29/15 1441  BP: 147/78 147/66 162/88 126/96  Pulse: 98 69 83 68  Temp: 98.6 F (37 C) 98.1 F (36.7 C) 94.3 F (34.6 C) 98.6 F (37 C)  TempSrc: Oral Oral  Axillary  Resp: 20 18 18 20   Weight:   52.617 kg (116 lb)   SpO2: 98% 94% 95% 95%    ABD soft Intact pulses distally Dorsiflexion/Plantar flexion intact Compartment soft Dressings c/d/i No hematoma  Lab Results  Component Value Date   WBC 8.0 09/29/2015   HGB 11.0* 09/29/2015   HCT 33.9* 09/29/2015   MCV 92.9 09/29/2015   PLT 172 09/29/2015   BMET    Component Value Date/Time   NA 138 09/29/2015 0557   K 4.0 09/29/2015 0557   CL 101 09/29/2015 0557   CO2 30 09/29/2015 0557   GLUCOSE 107* 09/29/2015 0557   BUN 16 09/29/2015 0557   CREATININE 0.82 09/29/2015 0557   CALCIUM 8.8* 09/29/2015 0557   GFRNONAA 57* 09/29/2015 0557   GFRAA >60 09/29/2015 0557     Assessment/Plan: 4 Days Post-Op   Principal Problem:   Intertrochanteric fracture of right hip (HCC) Active Problems:   Fall   Benign essential HTN   Fracture, intertrochanteric, right femur (HCC)   Hypotension   Acute delirium   Postoperative anemia due to acute blood loss   Noninfected skin tear of left leg   TDWB RLE with walker DVT ppx: ASA 81 mg PO BID due to bleeding PT/OT for bed to chair xfers ABLA: responded to PRBCs Dispo: D/C planning   Paw Karstens, Cloyde ReamsBrian James 09/29/2015, 3:41 PM   Samson FredericBrian Demonica Farrey, MD Cell [phone]

## 2015-09-29 NOTE — Care Management (Signed)
Per Dr. Linna CapriceSwinteck, Patient is medically ready for discharge today to SNF. Case manager informed Social worker and Dr. Sharl MaLama was notified.

## 2015-09-29 NOTE — Progress Notes (Signed)
[facility]. Pt discharge education and instructions completed. Pt IV removed; incision and wound dsg remains clean, dry and intact. Pt cleaned and family remains at bedside. Picked up by [facility] to be transported to disposition. Pt transported via stretcher with family and belongings to the side. Dionne BucyP. Amo Deicy Rusk RN

## 2015-09-29 NOTE — Consult Note (Addendum)
WOC wound consult note Reason for Consult: Consult requested for several skin tears.  Family member at the bedside to assess locations during consult.  Pt plans to transfer to a nursing facility today and has very thin fragile skin. Wound type: Right arm with partial thickness skin tear; .2X.2X.1cm, pink and moist, no odor or drainage.   Sacrum with patchy area of partial thickness skin loss, affected scattered across area .3X.3X.1cm, appearance is NOT consistent with a pressure injury, it appears to be related to moisture. Measurement: Left leg with full thickness skin tear; affected area to calf 10X5X.1cm, 40% dark purple-red wound bed, scant amt bleeding, skin approximated over 60% of the abrasion. Periwound: Multiple areas of dark purple bruising surrounding area of abrasion. Dressing procedure/placement/frequency: Foam dressing to protect sacrum and right arm.  Xeroform gauze to promote moist healing to left leg abrasion.  Discussed plan of care with daughter at bedside. Please re-consult if further assistance is needed.  Thank-you,  Cammie Mcgeeawn Jhayden Demuro MSN, RN, CWOCN, [place], CNS [phone]

## 2015-09-29 NOTE — Progress Notes (Signed)
Physical Therapy Treatment Patient Details Name: [name] MRN: [id number] DOB: 04-12-1915 Today's Date: 09/29/2015    History of Present Illness 82 year old female with past medical history significant for hypertension, dementia who presented to [facility] long hospital status post mechanical fall after trying to get from wheelchair to walker. was transferred to [facility] and is now s/p ORIF for R intertrochanteric hip fx.    PT Comments    Pt is stating to progress with her mobility. She was able to stand with her own RW and take some pivotal steps today and then stand again for >3 mins for peri care.  She is likely not maintaining TDWB 30%, but we are attempting to weight shift her off of her right side during standing and transfers.  She would benefit from maximum amount of allowable therapy at SNF as she was so independent PTA.    Follow Up Recommendations  SNF     Equipment Recommendations  None recommended by PT    Recommendations for Other Services   NA     Precautions / Restrictions Precautions Precautions: Fall Precaution Comments: fear of falling, significant posterior lean Restrictions RLE Weight Bearing: Touchdown weight bearing RLE Partial Weight Bearing Percentage or Pounds: 30    Mobility  Bed Mobility Overal bed mobility: Needs Assistance Bed Mobility: Supine to Sit     Supine to sit: Mod assist;HOB elevated     General bed mobility comments: mod assist to support trunk during transition to sitting EOB. Min assist to help prgress bil legs to EOB. Multimodal cues to reach for bed rail with both arms to pull to sitting. HOB ~40 degrees  Transfers Overall transfer level: Needs assistance Equipment used: Rolling walker (2 wheeled) Transfers: Sit to/from [employer] Sit to Stand: +2 physical assistance;Max assist;From elevated surface Stand pivot transfers: +2 physical assistance;Max assist;From elevated surface       General transfer  comment: Stand pivot transfer with her own RW from home with two person assist Assist needed to support trunk and block bil feet due to posterior lean in standing, Stood from elevated bed x 1 and from lower recliner chair x 1 to preform peri care.    Ambulation/Gait             General Gait Details: Unable at this time.           Balance Overall balance assessment: Needs assistance Sitting-balance support: Feet supported;Bilateral upper extremity supported Sitting balance-Leahy Scale: Poor Sitting balance - Comments: Min to mod assist EOB to prevent posterior LOB in sitting Postural control: Posterior lean Standing balance support: Bilateral upper extremity supported Standing balance-Leahy Scale: Zero Standing balance comment: Pt needs two person max assist to support trunk and prevent LOB.  Pt was able to stand for ~3 mins for peri care from recliner chair with two person assist, however, despite verbal and tactile cues I believe she is doing more than TDWB, more like PWB.                      Cognition Arousal/Alertness: Awake/alert Behavior During Therapy: WFL for tasks assessed/performed Overall Cognitive Status: History of cognitive impairments - at baseline (a bit exacerbated here in the hosptial)                      Exercises Total Joint Exercises Ankle Circles/Pumps: AAROM;Both;20 reps Quad Sets: AROM;Both;10 reps Heel Slides: AAROM;Both;10 reps Hip ABduction/ADduction: AAROM;Both;10 reps Long Arc Quad: AROM;Both;10 reps  Pertinent Vitals/Pain Pain Assessment: Faces Faces Pain Scale: Hurts little more Pain Location: right leg Pain Descriptors / Indicators: Grimacing;Guarding Pain Intervention(s): Limited activity within patient's tolerance;Monitored during session;Repositioned;Premedicated before session           PT Goals (current goals can now be found in the care plan section) Acute Rehab PT Goals Patient Stated Goal: to be able  to use RW again Progress towards PT goals: Progressing toward goals    Frequency  Min 3X/week    PT Plan Current plan remains appropriate       End of Session Equipment Utilized During Treatment: Gait belt Activity Tolerance: Patient limited by fatigue Patient left: in chair;with call bell/phone within reach;with chair alarm set;with family/visitor present     Time: [id number] PT Time Calculation (min) (ACUTE ONLY): 46 min  Charges:  $Therapeutic Exercise: 8-22 mins $Therapeutic Activity: 23-37 mins                      Delaine Canter B. Othel Hoogendoorn, PT, DPT [phone]   09/29/2015, 11:27 AM

## 2015-09-30 LAB — TYPE AND SCREEN
ABO/RH(D): O POS
Antibody Screen: NEGATIVE
UNIT DIVISION: 0
UNIT DIVISION: 0
Unit division: 0
Unit division: 0

## 2015-10-02 ENCOUNTER — Encounter (HOSPITAL_COMMUNITY): Payer: Self-pay | Admitting: Orthopedic Surgery

## 2016-11-18 DIAGNOSIS — N390 Urinary tract infection, site not specified: Secondary | ICD-10-CM | POA: Diagnosis not present

## 2016-11-18 DIAGNOSIS — R4182 Altered mental status, unspecified: Secondary | ICD-10-CM

## 2016-11-19 DIAGNOSIS — N390 Urinary tract infection, site not specified: Secondary | ICD-10-CM | POA: Diagnosis not present

## 2016-11-19 DIAGNOSIS — R4182 Altered mental status, unspecified: Secondary | ICD-10-CM | POA: Diagnosis not present

## 2016-11-20 DIAGNOSIS — N390 Urinary tract infection, site not specified: Secondary | ICD-10-CM | POA: Diagnosis not present

## 2016-11-20 DIAGNOSIS — R4182 Altered mental status, unspecified: Secondary | ICD-10-CM | POA: Diagnosis not present

## 2016-11-21 DIAGNOSIS — R4182 Altered mental status, unspecified: Secondary | ICD-10-CM | POA: Diagnosis not present

## 2016-11-21 DIAGNOSIS — N390 Urinary tract infection, site not specified: Secondary | ICD-10-CM | POA: Diagnosis not present

## 2016-11-22 DIAGNOSIS — N390 Urinary tract infection, site not specified: Secondary | ICD-10-CM | POA: Diagnosis not present

## 2016-11-22 DIAGNOSIS — R4182 Altered mental status, unspecified: Secondary | ICD-10-CM | POA: Diagnosis not present

## 2017-01-05 DEATH — deceased

## 2017-06-03 IMAGING — CR DG FEMUR 2+V PORT*R*
3 series · 3 of 3 positions shown · non-contrast
Comparison: 09/24/2015.

CLINICAL DATA: [AGE] female with history of ORIF of the right
hip.

EXAM:
RIGHT FEMUR PORTABLE 1 VIEW

[AP]
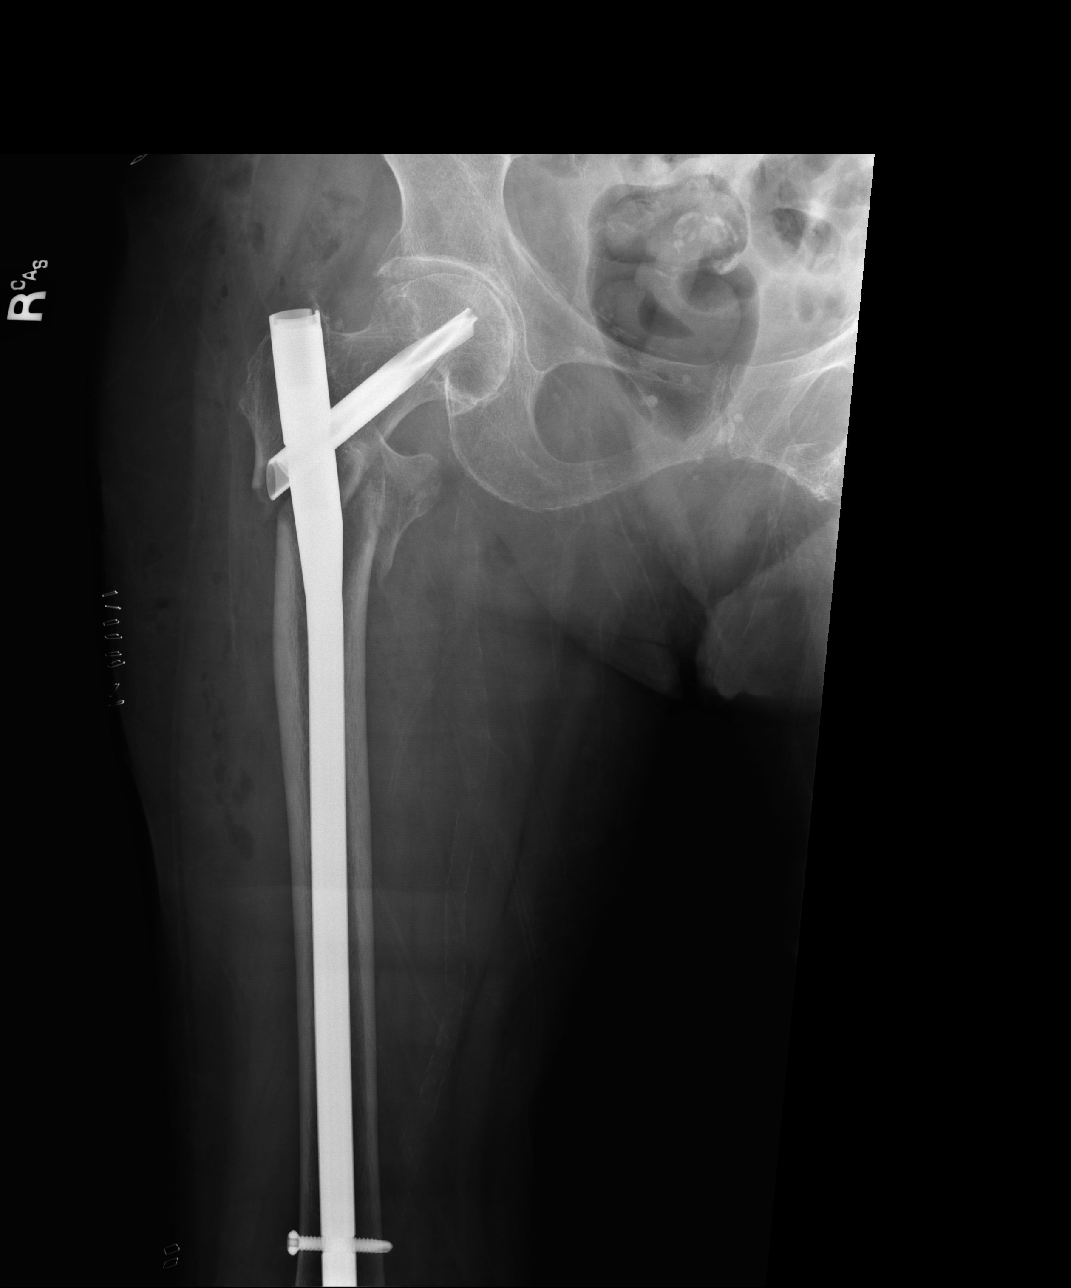

[lateral]
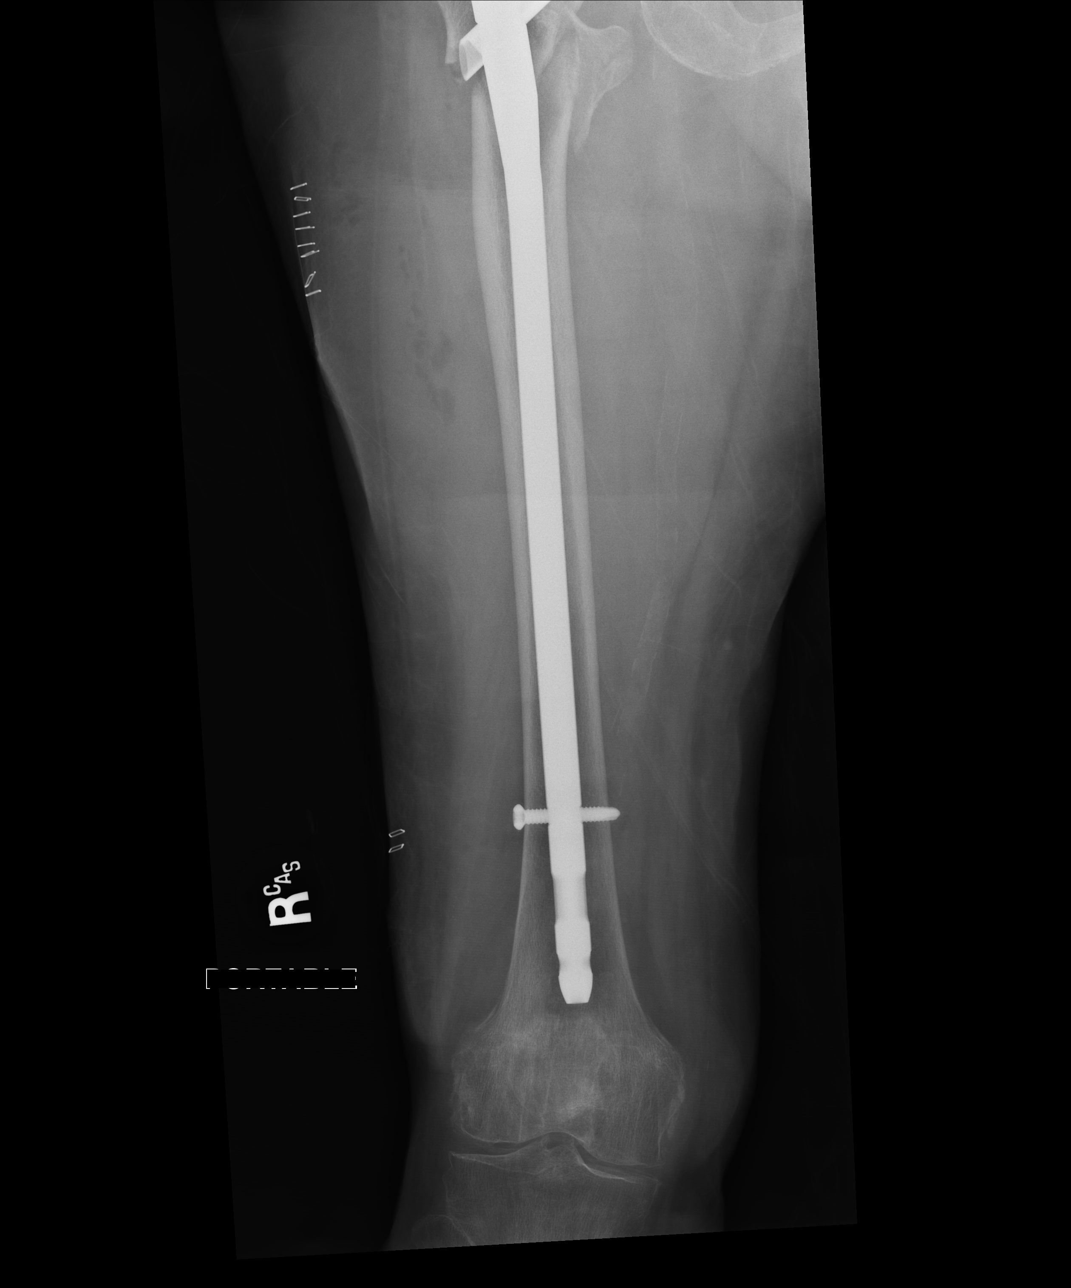

[xtable lateral]
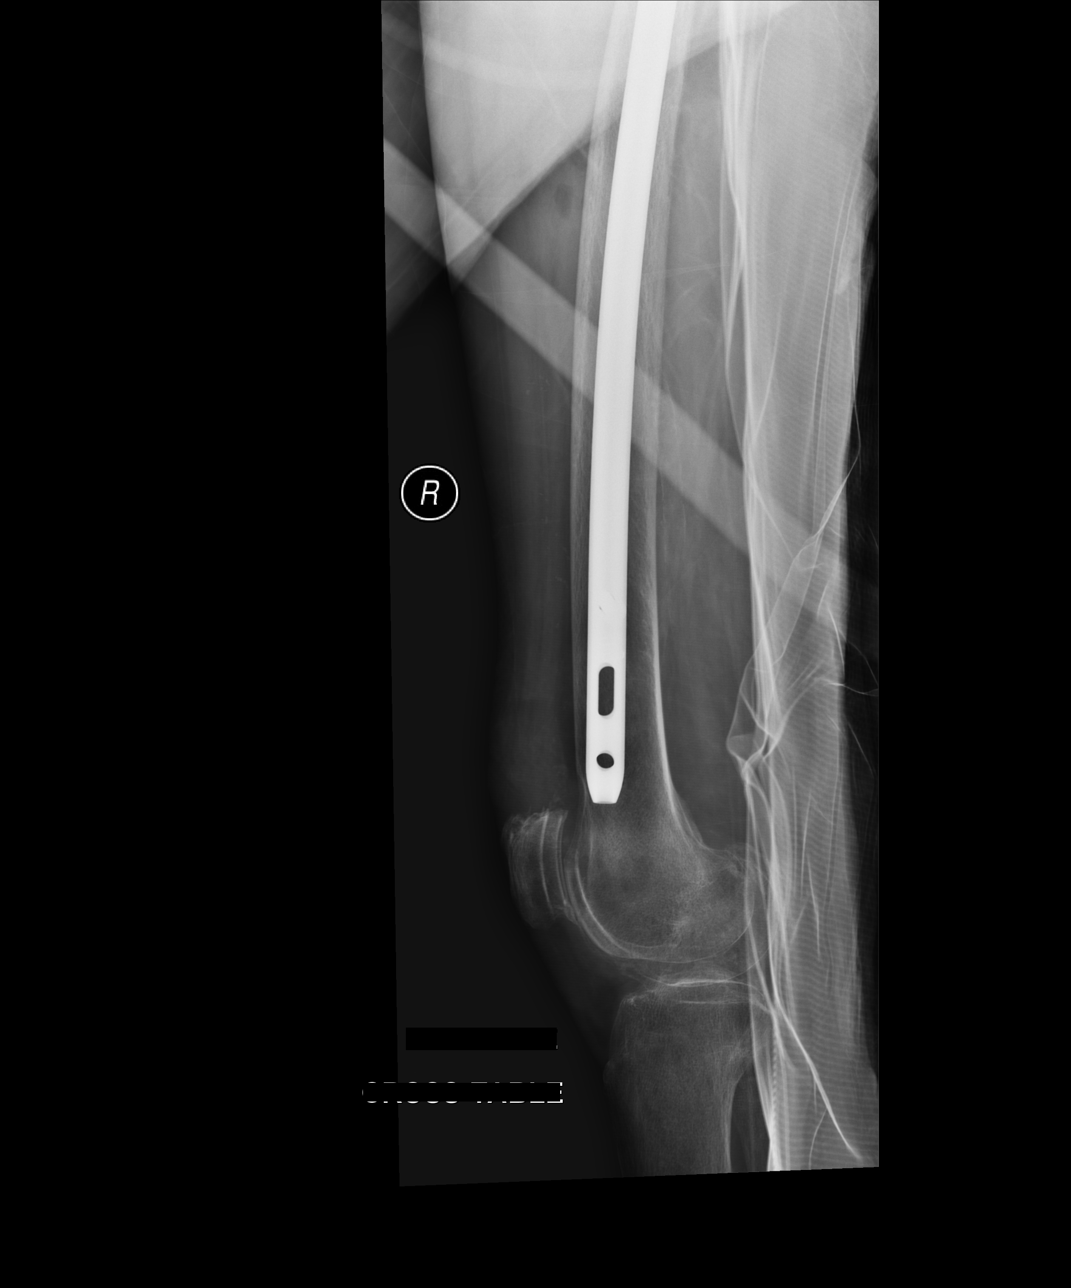

[3 of 3 positions shown; findings below may reference images not displayed]

FINDINGS: There is a new long stem gamma nail fixation device traversing the
previously demonstrated intertrochanteric fracture of the right
proximal femur. Restoration of near anatomic alignment has been
achieved. There are skin staples lateral to the ileum, proximal
femur and distal femur. Gas is present in the soft tissues lateral
to the right hip joint.
IMPRESSION: 1. Expected postoperative changes of ORIF for right femur
intertrochanteric fracture, as above, without acute complicating
features.
